# Patient Record
Sex: Female | Born: 1940
Health system: Southern US, Community
[De-identification: ages and names within clinical notes are randomized; demographics above are authoritative.]

## PROBLEM LIST (undated history)

## (undated) DIAGNOSIS — F17210 Nicotine dependence, cigarettes, uncomplicated: Secondary | ICD-10-CM

## (undated) DIAGNOSIS — L03115 Cellulitis of right lower limb: Secondary | ICD-10-CM

## (undated) DIAGNOSIS — K589 Irritable bowel syndrome without diarrhea: Secondary | ICD-10-CM

## (undated) DIAGNOSIS — F419 Anxiety disorder, unspecified: Secondary | ICD-10-CM

## (undated) DIAGNOSIS — M8580 Other specified disorders of bone density and structure, unspecified site: Secondary | ICD-10-CM

## (undated) DIAGNOSIS — I10 Essential (primary) hypertension: Secondary | ICD-10-CM

## (undated) DIAGNOSIS — M1990 Unspecified osteoarthritis, unspecified site: Secondary | ICD-10-CM

## (undated) DIAGNOSIS — K5730 Diverticulosis of large intestine without perforation or abscess without bleeding: Secondary | ICD-10-CM

## (undated) DIAGNOSIS — K219 Gastro-esophageal reflux disease without esophagitis: Secondary | ICD-10-CM

## (undated) DIAGNOSIS — E785 Hyperlipidemia, unspecified: Secondary | ICD-10-CM

## (undated) HISTORY — DX: Gastro-esophageal reflux disease without esophagitis: K21.9

## (undated) HISTORY — DX: Anxiety disorder, unspecified: F41.9

## (undated) HISTORY — DX: Nicotine dependence, cigarettes, uncomplicated: F17.210

## (undated) HISTORY — DX: Unspecified osteoarthritis, unspecified site: M19.90

## (undated) HISTORY — DX: Cellulitis of right lower limb: L03.115

## (undated) HISTORY — DX: Essential (primary) hypertension: I10

## (undated) HISTORY — PX: TOTAL ABDOMINAL HYSTERECTOMY: SHX209

## (undated) HISTORY — DX: Hyperlipidemia, unspecified: E78.5

## (undated) HISTORY — DX: Irritable bowel syndrome, unspecified: K58.9

## (undated) HISTORY — DX: Other specified disorders of bone density and structure, unspecified site: M85.80

## (undated) HISTORY — PX: APPENDECTOMY: SHX54

## (undated) HISTORY — DX: Diverticulosis of large intestine without perforation or abscess without bleeding: K57.30

---

## 2001-04-27 ENCOUNTER — Other Ambulatory Visit: Admission: RE | Admit: 2001-04-27 | Discharge: 2001-04-27 | Payer: Self-pay | Admitting: Obstetrics & Gynecology

## 2001-05-11 ENCOUNTER — Encounter: Payer: Self-pay | Admitting: Obstetrics & Gynecology

## 2001-05-11 ENCOUNTER — Encounter: Admission: RE | Admit: 2001-05-11 | Discharge: 2001-05-11 | Payer: Self-pay | Admitting: Obstetrics & Gynecology

## 2002-05-02 ENCOUNTER — Other Ambulatory Visit: Admission: RE | Admit: 2002-05-02 | Discharge: 2002-05-02 | Payer: Self-pay | Admitting: Obstetrics & Gynecology

## 2002-05-15 ENCOUNTER — Encounter: Admission: RE | Admit: 2002-05-15 | Discharge: 2002-05-15 | Payer: Self-pay | Admitting: Obstetrics & Gynecology

## 2002-05-15 ENCOUNTER — Encounter: Payer: Self-pay | Admitting: Obstetrics & Gynecology

## 2002-09-22 ENCOUNTER — Ambulatory Visit (HOSPITAL_COMMUNITY): Admission: RE | Admit: 2002-09-22 | Discharge: 2002-09-22 | Payer: Self-pay | Admitting: Pulmonary Disease

## 2002-09-22 ENCOUNTER — Encounter: Payer: Self-pay | Admitting: Pulmonary Disease

## 2003-07-16 ENCOUNTER — Encounter: Admission: RE | Admit: 2003-07-16 | Discharge: 2003-07-16 | Payer: Self-pay | Admitting: Obstetrics & Gynecology

## 2004-01-03 ENCOUNTER — Emergency Department (HOSPITAL_COMMUNITY): Admission: EM | Admit: 2004-01-03 | Discharge: 2004-01-04 | Payer: Self-pay | Admitting: Emergency Medicine

## 2004-07-17 ENCOUNTER — Ambulatory Visit: Payer: Self-pay | Admitting: Pulmonary Disease

## 2004-12-03 ENCOUNTER — Ambulatory Visit: Payer: Self-pay | Admitting: Pulmonary Disease

## 2004-12-05 ENCOUNTER — Ambulatory Visit: Payer: Self-pay | Admitting: Internal Medicine

## 2004-12-18 ENCOUNTER — Ambulatory Visit: Payer: Self-pay | Admitting: Internal Medicine

## 2005-01-09 ENCOUNTER — Ambulatory Visit: Payer: Self-pay | Admitting: Internal Medicine

## 2005-04-09 ENCOUNTER — Ambulatory Visit: Payer: Self-pay | Admitting: Pulmonary Disease

## 2005-06-04 ENCOUNTER — Ambulatory Visit: Payer: Self-pay | Admitting: Pulmonary Disease

## 2005-06-09 ENCOUNTER — Ambulatory Visit: Payer: Self-pay | Admitting: Pulmonary Disease

## 2005-06-17 ENCOUNTER — Encounter: Admission: RE | Admit: 2005-06-17 | Discharge: 2005-06-17 | Payer: Self-pay | Admitting: Obstetrics & Gynecology

## 2005-07-08 ENCOUNTER — Ambulatory Visit: Payer: Self-pay | Admitting: Pulmonary Disease

## 2005-12-21 ENCOUNTER — Ambulatory Visit: Payer: Self-pay | Admitting: Pulmonary Disease

## 2006-06-29 ENCOUNTER — Ambulatory Visit: Payer: Self-pay | Admitting: Pulmonary Disease

## 2006-09-01 ENCOUNTER — Ambulatory Visit: Payer: Self-pay | Admitting: Pulmonary Disease

## 2006-09-09 ENCOUNTER — Ambulatory Visit: Payer: Self-pay | Admitting: Pulmonary Disease

## 2006-09-09 LAB — CONVERTED CEMR LAB
ALT: 17 units/L (ref 0–40)
AST: 22 units/L (ref 0–37)
Albumin: 3.9 g/dL (ref 3.5–5.2)
Alkaline Phosphatase: 88 units/L (ref 39–117)
BUN: 14 mg/dL (ref 6–23)
CO2: 27 meq/L (ref 19–32)
Calcium: 9.1 mg/dL (ref 8.4–10.5)
Chloride: 103 meq/L (ref 96–112)
Chol/HDL Ratio, serum: 4.7
Cholesterol: 263 mg/dL (ref 0–200)
Creatinine, Ser: 0.8 mg/dL (ref 0.4–1.2)
GFR calc non Af Amer: 76 mL/min
Glomerular Filtration Rate, Af Am: 92 mL/min/{1.73_m2}
Glucose, Bld: 95 mg/dL (ref 70–99)
HDL: 55.7 mg/dL (ref >39.0)
LDL DIRECT: 180.2 mg/dL
Potassium: 4.1 meq/L (ref 3.5–5.1)
Sodium: 138 meq/L (ref 135–145)
Total Bilirubin: 0.7 mg/dL (ref 0.3–1.2)
Total Protein: 6.5 g/dL (ref 6.0–8.3)
Triglyceride fasting, serum: 126 mg/dL (ref 0–149)
VLDL: 25 mg/dL (ref 0–40)

## 2006-09-24 ENCOUNTER — Encounter: Admission: RE | Admit: 2006-09-24 | Discharge: 2006-09-24 | Payer: Self-pay | Admitting: Obstetrics & Gynecology

## 2006-10-13 ENCOUNTER — Ambulatory Visit: Payer: Self-pay | Admitting: Pulmonary Disease

## 2006-12-29 ENCOUNTER — Ambulatory Visit: Payer: Self-pay | Admitting: Pulmonary Disease

## 2006-12-29 LAB — CONVERTED CEMR LAB
ALT: 20 units/L (ref 0–40)
AST: 30 units/L (ref 0–37)
Albumin: 4.2 g/dL (ref 3.5–5.2)
Alkaline Phosphatase: 89 units/L (ref 39–117)
BUN: 10 mg/dL (ref 6–23)
Bacteria, UA: NEGATIVE
Basophils Absolute: 0.1 10*3/uL (ref 0.0–0.1)
Basophils Relative: 0.9 % (ref 0.0–1.0)
Bilirubin Urine: NEGATIVE
Bilirubin, Direct: 0.1 mg/dL (ref 0.0–0.3)
CO2: 29 meq/L (ref 19–32)
Calcium: 9.3 mg/dL (ref 8.4–10.5)
Chloride: 109 meq/L (ref 96–112)
Cholesterol: 221 mg/dL (ref 0–200)
Creatinine, Ser: 0.8 mg/dL (ref 0.4–1.2)
Crystals: NEGATIVE
Direct LDL: 132.3 mg/dL
Eosinophils Absolute: 0.1 10*3/uL (ref 0.0–0.6)
Eosinophils Relative: 1.5 % (ref 0.0–5.0)
GFR calc Af Amer: 92 mL/min
GFR calc non Af Amer: 76 mL/min
Glucose, Bld: 96 mg/dL (ref 70–99)
HCT: 40.1 % (ref 36.0–46.0)
HDL: 56.5 mg/dL (ref >39.0)
Hemoglobin: 13.8 g/dL (ref 12.0–15.0)
Ketones, ur: NEGATIVE mg/dL
Leukocytes, UA: NEGATIVE
Lymphocytes Relative: 31.1 % (ref 12.0–46.0)
MCHC: 34.4 g/dL (ref 30.0–36.0)
MCV: 92.3 fL (ref 78.0–100.0)
Monocytes Absolute: 0.3 10*3/uL (ref 0.2–0.7)
Monocytes Relative: 5 % (ref 3.0–11.0)
Mucus, UA: NEGATIVE
Neutro Abs: 4.2 10*3/uL (ref 1.4–7.7)
Neutrophils Relative %: 61.5 % (ref 43.0–77.0)
Nitrite: NEGATIVE
Platelets: 323 10*3/uL (ref 150–400)
Potassium: 4.3 meq/L (ref 3.5–5.1)
RBC: 4.34 M/uL (ref 3.87–5.11)
RDW: 12.1 % (ref 11.5–14.6)
Sodium: 144 meq/L (ref 135–145)
Specific Gravity, Urine: 1.02 (ref 1.000–1.03)
TSH: 2.61 microintl units/mL (ref 0.35–5.50)
Total Bilirubin: 0.7 mg/dL (ref 0.3–1.2)
Total CHOL/HDL Ratio: 3.9
Total Protein, Urine: NEGATIVE mg/dL
Total Protein: 7 g/dL (ref 6.0–8.3)
Triglycerides: 142 mg/dL (ref 0–149)
Urine Glucose: NEGATIVE mg/dL
Urobilinogen, UA: 0.2 (ref 0.0–1.0)
VLDL: 28 mg/dL (ref 0–40)
WBC, UA: NONE SEEN cells/hpf
WBC: 6.8 10*3/uL (ref 4.5–10.5)
pH: 6 (ref 5.0–8.0)

## 2007-06-15 ENCOUNTER — Ambulatory Visit: Payer: Self-pay | Admitting: Pulmonary Disease

## 2007-11-17 ENCOUNTER — Encounter: Admission: RE | Admit: 2007-11-17 | Discharge: 2007-11-17 | Payer: Self-pay | Admitting: Obstetrics & Gynecology

## 2008-01-07 ENCOUNTER — Encounter: Payer: Self-pay | Admitting: Pulmonary Disease

## 2008-01-07 DIAGNOSIS — E785 Hyperlipidemia, unspecified: Secondary | ICD-10-CM | POA: Insufficient documentation

## 2008-01-07 DIAGNOSIS — I10 Essential (primary) hypertension: Secondary | ICD-10-CM | POA: Insufficient documentation

## 2008-01-07 DIAGNOSIS — K219 Gastro-esophageal reflux disease without esophagitis: Secondary | ICD-10-CM | POA: Insufficient documentation

## 2008-02-16 ENCOUNTER — Telehealth (INDEPENDENT_AMBULATORY_CARE_PROVIDER_SITE_OTHER): Payer: Self-pay | Admitting: *Deleted

## 2008-02-20 ENCOUNTER — Telehealth: Payer: Self-pay | Admitting: Pulmonary Disease

## 2008-02-21 ENCOUNTER — Ambulatory Visit: Payer: Self-pay | Admitting: Pulmonary Disease

## 2008-02-22 ENCOUNTER — Ambulatory Visit: Payer: Self-pay | Admitting: Pulmonary Disease

## 2008-02-22 DIAGNOSIS — F17200 Nicotine dependence, unspecified, uncomplicated: Secondary | ICD-10-CM | POA: Insufficient documentation

## 2008-02-26 LAB — CONVERTED CEMR LAB
ALT: 20 units/L (ref 0–35)
AST: 27 units/L (ref 0–37)
Albumin: 4.3 g/dL (ref 3.5–5.2)
Alkaline Phosphatase: 80 units/L (ref 39–117)
BUN: 11 mg/dL (ref 6–23)
Basophils Absolute: 0 10*3/uL (ref 0.0–0.1)
Basophils Relative: 0.4 % (ref 0.0–1.0)
Bilirubin, Direct: 0.1 mg/dL (ref 0.0–0.3)
CO2: 28 meq/L (ref 19–32)
Calcium: 9.2 mg/dL (ref 8.4–10.5)
Chloride: 106 meq/L (ref 96–112)
Cholesterol: 195 mg/dL (ref 0–200)
Creatinine, Ser: 0.7 mg/dL (ref 0.4–1.2)
Eosinophils Absolute: 0.1 10*3/uL (ref 0.0–0.7)
Eosinophils Relative: 1.8 % (ref 0.0–5.0)
GFR calc Af Amer: 107 mL/min
GFR calc non Af Amer: 88 mL/min
Glucose, Bld: 94 mg/dL (ref 70–99)
HCT: 42.3 % (ref 36.0–46.0)
HDL: 57.2 mg/dL (ref >39.0)
Hemoglobin: 14.7 g/dL (ref 12.0–15.0)
Hgb A1c MFr Bld: 5.4 % (ref 4.6–6.0)
LDL Cholesterol: 119 mg/dL — ABNORMAL HIGH (ref 0–99)
Lymphocytes Relative: 29.2 % (ref 12.0–46.0)
MCHC: 34.7 g/dL (ref 30.0–36.0)
MCV: 93.6 fL (ref 78.0–100.0)
Monocytes Absolute: 0.5 10*3/uL (ref 0.1–1.0)
Monocytes Relative: 6.3 % (ref 3.0–12.0)
Neutro Abs: 4.6 10*3/uL (ref 1.4–7.7)
Neutrophils Relative %: 62.3 % (ref 43.0–77.0)
Platelets: 302 10*3/uL (ref 150–400)
Potassium: 4 meq/L (ref 3.5–5.1)
RBC: 4.52 M/uL (ref 3.87–5.11)
RDW: 11.6 % (ref 11.5–14.6)
Sodium: 142 meq/L (ref 135–145)
TSH: 2.32 microintl units/mL (ref 0.35–5.50)
Total Bilirubin: 0.8 mg/dL (ref 0.3–1.2)
Total CHOL/HDL Ratio: 3.4
Total Protein: 6.9 g/dL (ref 6.0–8.3)
Triglycerides: 95 mg/dL (ref 0–149)
VLDL: 19 mg/dL (ref 0–40)
WBC: 7.3 10*3/uL (ref 4.5–10.5)

## 2008-03-04 DIAGNOSIS — M899 Disorder of bone, unspecified: Secondary | ICD-10-CM | POA: Insufficient documentation

## 2008-03-04 DIAGNOSIS — K5730 Diverticulosis of large intestine without perforation or abscess without bleeding: Secondary | ICD-10-CM | POA: Insufficient documentation

## 2008-03-04 DIAGNOSIS — M1990 Unspecified osteoarthritis, unspecified site: Secondary | ICD-10-CM | POA: Insufficient documentation

## 2008-03-04 DIAGNOSIS — F411 Generalized anxiety disorder: Secondary | ICD-10-CM | POA: Insufficient documentation

## 2008-03-04 DIAGNOSIS — K589 Irritable bowel syndrome without diarrhea: Secondary | ICD-10-CM | POA: Insufficient documentation

## 2008-03-04 DIAGNOSIS — M949 Disorder of cartilage, unspecified: Secondary | ICD-10-CM

## 2008-06-06 ENCOUNTER — Ambulatory Visit: Payer: Self-pay | Admitting: Pulmonary Disease

## 2008-11-28 ENCOUNTER — Encounter: Admission: RE | Admit: 2008-11-28 | Discharge: 2008-11-28 | Payer: Self-pay | Admitting: Obstetrics & Gynecology

## 2009-02-18 ENCOUNTER — Encounter: Payer: Self-pay | Admitting: Pulmonary Disease

## 2009-03-29 ENCOUNTER — Ambulatory Visit: Payer: Self-pay | Admitting: Pulmonary Disease

## 2009-03-29 DIAGNOSIS — H409 Unspecified glaucoma: Secondary | ICD-10-CM | POA: Insufficient documentation

## 2009-03-29 LAB — CONVERTED CEMR LAB
ALT: 16 units/L (ref 0–35)
AST: 24 units/L (ref 0–37)
Albumin: 4.4 g/dL (ref 3.5–5.2)
Alkaline Phosphatase: 79 units/L (ref 39–117)
BUN: 15 mg/dL (ref 6–23)
Basophils Absolute: 0.1 10*3/uL (ref 0.0–0.1)
Basophils Relative: 0.8 % (ref 0.0–3.0)
Bilirubin, Direct: 0.1 mg/dL (ref 0.0–0.3)
CO2: 28 meq/L (ref 19–32)
Calcium: 9.5 mg/dL (ref 8.4–10.5)
Chloride: 109 meq/L (ref 96–112)
Cholesterol: 238 mg/dL — ABNORMAL HIGH (ref 0–200)
Creatinine, Ser: 0.6 mg/dL (ref 0.4–1.2)
Direct LDL: 160.4 mg/dL
Eosinophils Absolute: 0.1 10*3/uL (ref 0.0–0.7)
Eosinophils Relative: 1.2 % (ref 0.0–5.0)
GFR calc non Af Amer: 105.19 mL/min (ref >60)
Glucose, Bld: 90 mg/dL (ref 70–99)
HCT: 41 % (ref 36.0–46.0)
HDL: 56.8 mg/dL (ref >39.00)
Hemoglobin: 14.2 g/dL (ref 12.0–15.0)
Lymphocytes Relative: 41.1 % (ref 12.0–46.0)
Lymphs Abs: 2.8 10*3/uL (ref 0.7–4.0)
MCHC: 34.5 g/dL (ref 30.0–36.0)
MCV: 93.5 fL (ref 78.0–100.0)
Monocytes Absolute: 0.4 10*3/uL (ref 0.1–1.0)
Monocytes Relative: 5.8 % (ref 3.0–12.0)
Neutro Abs: 3.5 10*3/uL (ref 1.4–7.7)
Neutrophils Relative %: 51.1 % (ref 43.0–77.0)
Platelets: 270 10*3/uL (ref 150.0–400.0)
Potassium: 4.8 meq/L (ref 3.5–5.1)
RBC: 4.38 M/uL (ref 3.87–5.11)
RDW: 11.7 % (ref 11.5–14.6)
Sodium: 143 meq/L (ref 135–145)
TSH: 1.94 microintl units/mL (ref 0.35–5.50)
Total Bilirubin: 0.7 mg/dL (ref 0.3–1.2)
Total CHOL/HDL Ratio: 4
Total Protein: 7.2 g/dL (ref 6.0–8.3)
Triglycerides: 90 mg/dL (ref 0.0–149.0)
VLDL: 18 mg/dL (ref 0.0–40.0)
WBC: 6.9 10*3/uL (ref 4.5–10.5)

## 2009-04-05 ENCOUNTER — Encounter: Payer: Self-pay | Admitting: Pulmonary Disease

## 2009-05-22 ENCOUNTER — Ambulatory Visit: Payer: Self-pay | Admitting: Pulmonary Disease

## 2009-05-22 ENCOUNTER — Encounter: Payer: Self-pay | Admitting: Adult Health

## 2009-05-24 ENCOUNTER — Ambulatory Visit: Payer: Self-pay | Admitting: Pulmonary Disease

## 2009-05-31 ENCOUNTER — Ambulatory Visit: Payer: Self-pay | Admitting: Pulmonary Disease

## 2009-12-23 ENCOUNTER — Encounter: Admission: RE | Admit: 2009-12-23 | Discharge: 2009-12-23 | Payer: Self-pay | Admitting: Obstetrics & Gynecology

## 2010-01-16 ENCOUNTER — Encounter (INDEPENDENT_AMBULATORY_CARE_PROVIDER_SITE_OTHER): Payer: Self-pay | Admitting: *Deleted

## 2010-05-23 ENCOUNTER — Ambulatory Visit: Payer: Self-pay | Admitting: Pulmonary Disease

## 2010-05-23 ENCOUNTER — Encounter (INDEPENDENT_AMBULATORY_CARE_PROVIDER_SITE_OTHER): Payer: Self-pay | Admitting: *Deleted

## 2010-05-26 LAB — CONVERTED CEMR LAB
ALT: 18 units/L (ref 0–35)
AST: 33 units/L (ref 0–37)
Albumin: 4.6 g/dL (ref 3.5–5.2)
Alkaline Phosphatase: 81 units/L (ref 39–117)
BUN: 14 mg/dL (ref 6–23)
Basophils Absolute: 0 10*3/uL (ref 0.0–0.1)
Basophils Relative: 0.6 % (ref 0.0–3.0)
Bilirubin Urine: NEGATIVE
Bilirubin, Direct: 0.1 mg/dL (ref 0.0–0.3)
CO2: 28 meq/L (ref 19–32)
Calcium: 9.9 mg/dL (ref 8.4–10.5)
Chloride: 103 meq/L (ref 96–112)
Cholesterol: 255 mg/dL — ABNORMAL HIGH (ref 0–200)
Creatinine, Ser: 0.7 mg/dL (ref 0.4–1.2)
Direct LDL: 169.2 mg/dL
Eosinophils Absolute: 0.1 10*3/uL (ref 0.0–0.7)
Eosinophils Relative: 1.8 % (ref 0.0–5.0)
GFR calc non Af Amer: 92.3 mL/min (ref >60)
Glucose, Bld: 91 mg/dL (ref 70–99)
HCT: 40.3 % (ref 36.0–46.0)
HDL: 64.2 mg/dL (ref >39.00)
Hemoglobin: 14 g/dL (ref 12.0–15.0)
Ketones, ur: NEGATIVE mg/dL
Leukocytes, UA: NEGATIVE
Lymphocytes Relative: 43.5 % (ref 12.0–46.0)
Lymphs Abs: 2.8 10*3/uL (ref 0.7–4.0)
MCHC: 34.7 g/dL (ref 30.0–36.0)
MCV: 92.7 fL (ref 78.0–100.0)
Monocytes Absolute: 0.4 10*3/uL (ref 0.1–1.0)
Monocytes Relative: 6.2 % (ref 3.0–12.0)
Neutro Abs: 3.1 10*3/uL (ref 1.4–7.7)
Neutrophils Relative %: 47.9 % (ref 43.0–77.0)
Nitrite: NEGATIVE
Platelets: 288 10*3/uL (ref 150.0–400.0)
Potassium: 6.1 meq/L (ref 3.5–5.1)
RBC: 4.35 M/uL (ref 3.87–5.11)
RDW: 12.7 % (ref 11.5–14.6)
Sodium: 139 meq/L (ref 135–145)
Specific Gravity, Urine: 1.01 (ref 1.000–1.030)
TSH: 2.54 microintl units/mL (ref 0.35–5.50)
Total Bilirubin: 0.7 mg/dL (ref 0.3–1.2)
Total CHOL/HDL Ratio: 4
Total Protein, Urine: NEGATIVE mg/dL
Total Protein: 6.9 g/dL (ref 6.0–8.3)
Triglycerides: 90 mg/dL (ref 0.0–149.0)
Urine Glucose: NEGATIVE mg/dL
Urobilinogen, UA: 0.2 (ref 0.0–1.0)
VLDL: 18 mg/dL (ref 0.0–40.0)
WBC: 6.5 10*3/uL (ref 4.5–10.5)
pH: 6.5 (ref 5.0–8.0)

## 2010-06-03 ENCOUNTER — Telehealth (INDEPENDENT_AMBULATORY_CARE_PROVIDER_SITE_OTHER): Payer: Self-pay | Admitting: *Deleted

## 2010-06-13 ENCOUNTER — Encounter (INDEPENDENT_AMBULATORY_CARE_PROVIDER_SITE_OTHER): Payer: Self-pay

## 2010-06-17 ENCOUNTER — Ambulatory Visit: Payer: Self-pay | Admitting: Internal Medicine

## 2010-07-02 ENCOUNTER — Ambulatory Visit: Payer: Self-pay | Admitting: Internal Medicine

## 2010-07-02 HISTORY — PX: COLONOSCOPY: SHX174

## 2010-08-12 ENCOUNTER — Ambulatory Visit: Payer: Self-pay | Admitting: Internal Medicine

## 2010-08-12 ENCOUNTER — Telehealth (INDEPENDENT_AMBULATORY_CARE_PROVIDER_SITE_OTHER): Payer: Self-pay | Admitting: *Deleted

## 2010-08-12 ENCOUNTER — Ambulatory Visit: Payer: Self-pay | Admitting: Pulmonary Disease

## 2010-09-30 NOTE — Procedures (Signed)
Summary: Colonoscopy  Patient: Alicia Dickson No Note: All result statuses are Final unless otherwise noted.  Tests: (1) Colonoscopy (COL)   COL Colonoscopy           DONE     [facility]     [address]     [place], [place]  [address]           COLONOSCOPY PROCEDURE REPORT           PATIENT:  Alicia, Dickson  MR#:  [id number]     BIRTHDATE:  August 04, 1941, 69 yrs. old  GENDER:  female     ENDOSCOPIST:  Wilhemina Bonito. Eda Keys, MD     REF. BY:  Screening /  Recall     PROCEDURE DATE:  07/02/2010     PROCEDURE:  Higher-risk screening colonoscopy G0105           ASA CLASS:  Class II     INDICATIONS:  screening, family history of colon cancer - parent @     55     MEDICATIONS:   Fentanyl 50 mcg IV, Versed 8 mg IV           DESCRIPTION OF PROCEDURE:   After the risks benefits and     alternatives of the procedure were thoroughly explained, informed     consent was obtained.  Digital rectal exam was performed and     revealed no abnormalities.   The LB 180AL [id number] endoscope was     introduced through the anus and advanced to the cecum, which was     identified by both the appendix and ileocecal valve, without     limitations.Time to cecum = 5:32 min.  The quality of the prep was     excellent, using MoviPrep.  The instrument was then slowly     withdrawn (time = 7:55 min) as the colon was fully examined.     <<PROCEDUREIMAGES>>           FINDINGS:  Severe diverticulosis was found throughout the colon     with marked distal sigmoid stenosis.  This was otherwise a normal     examination of the colon.  No polyps or cancers were seen.     Retroflexed views in the rectum revealed no abnormalities.    The     scope was then withdrawn from the patient and the procedure     completed.           COMPLICATIONS:  None           ENDOSCOPIC IMPRESSION:     1) Severe diverticulosis throughout the colon, with sigmoid     stenosis     2) Otherwise normal examination     3) No polyps or  cancers     RECOMMENDATIONS:     1) Follow up colonoscopy in 5 years           ______________________________     Wilhemina Bonito. Eda Keys, MD           CC:  Michele Mcalpine, MD; The Patient           n.     eSIGNED:   Wilhemina Bonito. Eda Keys at 07/02/2010 02:52 PM           York Ram, [id number]  Note: An exclamation mark (!) indicates a result that was not dispersed into the flowsheet. Document Creation Date: 07/02/2010 2:53 PM _______________________________________________________________________  (1) Order result status: Final Collection or  observation date-time: 07/02/2010 14:45 Requested date-time:  Receipt date-time:  Reported date-time:  Referring Physician:   Ordering Physician: Fransico Setters [phone]) Specimen Source:  Source: Alicia Dickson Order Number: [id number] Lab site:   Appended Document: Colonoscopy    Clinical Lists Changes  Observations: Added new observation of COLONNXTDUE: 07/2015 (07/02/2010 15:08)

## 2010-09-30 NOTE — Letter (Signed)
Summary: [facility] Instructions  [facility]  [address] [place], [place] [address]   Phone: [phone]  Fax: [phone]       Alicia Dickson    [birth year]    MRN: [id number]        Procedure Day Dorna Bloom: Wednesday 07-02-10     Arrival Time: 1:00 p.m.     Procedure Time: 2:00 p.m.     Location of Procedure:                    _x _  [facility] (4th Floor)                        PREPARATION FOR COLONOSCOPY WITH MOVIPREP   Starting 5 days prior to your procedure  06-27-10 do not eat nuts, seeds, popcorn, corn, beans, peas,  salads, or any raw vegetables.  Do not take any fiber supplements (e.g. Metamucil, Citrucel, and Benefiber).  THE DAY BEFORE YOUR PROCEDURE         DATE:  07-01-10  DAY:  Tuesday  1.  Drink clear liquids the entire day-NO SOLID FOOD  2.  Do not drink anything colored red or purple.  Avoid juices with pulp.  No orange juice.  3.  Drink at least 64 oz. (8 glasses) of fluid/clear liquids during the day to prevent dehydration and help the prep work efficiently.  CLEAR LIQUIDS INCLUDE: Water Jello Ice Popsicles Tea (sugar ok, no milk/cream) Powdered fruit flavored drinks Coffee (sugar ok, no milk/cream) Gatorade Juice: apple, white grape, white cranberry  Lemonade Clear bullion, consomm, broth Carbonated beverages (any kind) Strained chicken noodle soup Hard Candy                             4.  In the morning, mix first dose of MoviPrep solution:    Empty 1 Pouch A and 1 Pouch B into the disposable container    Add lukewarm drinking water to the top line of the container. Mix to dissolve    Refrigerate (mixed solution should be used within 24 hrs)  5.  Begin drinking the prep at 5:00 p.m. The MoviPrep container is divided by 4 marks.   Every 15 minutes drink the solution down to the next mark (approximately 8 oz) until the full liter is complete.   6.  Follow completed prep with 16 oz of clear liquid of your  choice (Nothing red or purple).  Continue to drink clear liquids until bedtime.  7.  Before going to bed, mix second dose of MoviPrep solution:    Empty 1 Pouch A and 1 Pouch B into the disposable container    Add lukewarm drinking water to the top line of the container. Mix to dissolve    Refrigerate  THE DAY OF YOUR PROCEDURE      DATE:  07-02-10  DAY:  Wednesday  Beginning at  9:00 a.m. (5 hours before procedure):         1. Every 15 minutes, drink the solution down to the next mark (approx 8 oz) until the full liter is complete.  2. Follow completed prep with 16 oz. of clear liquid of your choice.    3. You may drink clear liquids until  12:00 p.m. (2 HOURS BEFORE PROCEDURE).   MEDICATION INSTRUCTIONS  Unless otherwise instructed, you should take regular prescription medications with a small sip of water  as early as possible the morning of your procedure.         OTHER INSTRUCTIONS  You will need a responsible adult at least 71 years of age to accompany you and drive you home.   This person must remain in the waiting room during your procedure.  Wear loose fitting clothing that is easily removed.  Leave jewelry and other valuables at home.  However, you may wish to bring a book to read or  an iPod/MP3 player to listen to music as you wait for your procedure to start.  Remove all body piercing jewelry and leave at home.  Total time from sign-in until discharge is approximately 2-3 hours.  You should go home directly after your procedure and rest.  You can resume normal activities the  day after your procedure.  The day of your procedure you should not:   Drive   Make legal decisions   Operate machinery   Drink alcohol   Return to work  You will receive specific instructions about eating, activities and medications before you leave.    The above instructions have been reviewed and explained to me by   Ulis Rias RN  June 17, 2010 1:01  PM     I fully understand and can verbalize these instructions _____________________________ Date _________

## 2010-09-30 NOTE — Assessment & Plan Note (Signed)
Summary: 1 YR PHYSICAL ///KP   CC:  14 month ROV & review of mult medical problems....  History of Present Illness: 70 y/o WF here for a follow up visit... she also goes by Senaida Lange (registered this way at the [facility])... she has multiple medical problems as noted below...    ~  March 29, 2009:  she had a good year but is stressed and fatigued/ tired... note sl left CWP at costal margin x several days, recent trip to [facility] to visit 77 y/o mother... still smoking, but may give Chantix a try... BP controlled & weight stable... had BMD from [facility] w/ worsening density ans osteoporosis but only on calcium/ vits... we discussed this but she still refuses bisposphonates, or any alternative Rx (Forteo, Prolia)...   ~  May 23, 2010:  Yearly ROV- still tired/ fatigued w/ [place] 55 y/[place]... she continues to smoke  ~1/4 ppd & never filled the Chantix (too $$$)... BP controlled on Felodipine & she states that she is now taking the Simva80 regularly but a call to her Pharm indicates refills  ~every other month & FLP today w/ persist elevated TChol & LDL> we will rec change to LIPITOR40... she is due for 40yr f/u colonoscopy w/ DrPerry... she still hasn't addressed the osteopenia w/ her GYN but says she will consider reclast once she sees how her sister does on this med... OK Flu shot & pneumovax today...    Current Problem List:  PHYSICAL EXAMINATION (ICD-V70.0) - GYN= DrLavoie for PAP, Mammograms, BMD... she has pt on ca++, MVI, VitD 50K per week (she was prev on Actonel but pt stopped)...  she had a Pneumovax in 2001... she takes ASA 81mg /d...  ~  7/10: non-compliance w/ meds- I called [facility]= no refills in several months (last was [facility] 11/17/08).  ~  9/11:  given PNEUMOVAX & seasonal flu vavvine today...  GLAUCOMA (ICD-365.9) - on gtts per DrScott...  CIGARETTE SMOKER (ICD-305.1) - still smokes 1/4 ppd... advised to quit totally and offered Chantix (but  she never filled Rx- too $$$)... she denies cough, sputum prod, hemoptysis, SOB, etc...  ~  CXR 9/11 showed scarring right base w/ sl elev hemidiaph, NAD...  HYPERTENSION (ICD-401.9) - on PLENDIL 10mg /d... BP= 136/82 today and averages 130's at home... denies HA, visual changes, CP, palipit, dizziness, syncope, dyspnea, edema, etc...  ~  [facility] confirm that she isn't taking med regularly.  ~  9/11:  pt states she is doing better & taking med more regularly.  HYPERLIPIDEMIA (ICD-272.4) - on ZOCOR 80mg /d now & tol well...  ~  FLP 4/06 showed TChol 208, TG 87, HDL 52, LDL 132... on Lipitor 20mg /d...  ~  FLP 4/08 on Simva40 showed TChol 221, TG 142, HDL 57, LDL 132... asked to incr to 80mg .  ~  FLP 6/09 on Simva80 showed TChol 195, TG 95, HDL 57, LDL 119  ~  FLP 7/10 showed TChol 238, TG 90, HDL 57, LDL 160... last refilled ?12/09?Marland Kitchen.. pt asked to take it regularly & call us for f/u labs on regular dose of med to assess her response (she never called for f/u FLP).  ~  FLP 9/11 showed TChol 255, TG 90, HDL 64, LDL 169... call to Pharm indicated she was filling the Rx every other month (therefore on Simva40/d)... rec> change to LIPITOR 40mg /d & call to check FLP after 1st of the yr.  GERD (ICD-530.81) -  uses OTC meds as needed.  DIVERTICULOSIS OF COLON (ICD-562.10) & IRRITABLE BOWEL SYNDROME (ICD-564.1) - last colonoscopy 5/06 by DrPerry showed divertics only... f/u planned 57yrs due to +fam hx colon cancer in father who died at age 44... we will set up repeat colon w/ DrPerry.  DEGENERATIVE JOINT DISEASE (ICD-715.90)  OSTEOPOROSIS (ICD-733.90) - BMD done by GYN... she was INTOL to Actonel, refuses to consider other bisphos or alternative Rx... she takes Caltrate (but stopped due to constip), Vits, Vit D (now 2000 u daily) according to the directions of DrLavoie.  ~  BMD [facility] showed TScores -2.5 in Spine, & -1.7 in right FemNeck...  ~  BMD 6/10 by  [facility] showed TScores -2.8 in Spine, & -2.0 in righjt FemNeck...  ANXIETY (ICD-300.00) - on WELLBUTRIN 75mg - 2 tabs daily... prev Rx w/ Alpraz as well but she stopped this medication.   Preventive Screening-Counseling & Management  Alcohol-Tobacco     Smoking Status: current  Comments: smokes no more than 5 cigs per day  Allergies: 1)  ! Pcn  Comments:  Nurse/Medical Assistant: The patient's medications and allergies were reviewed with the patient and were updated in the Medication and Allergy Lists.  Past History:  Past Medical History: Current Problems:  CELLULITIS, LEG, RIGHT (ICD-682.6) GLAUCOMA (ICD-365.9) CIGARETTE SMOKER (ICD-305.1) HYPERTENSION (ICD-401.9) HYPERLIPIDEMIA (ICD-272.4) GERD (ICD-530.81) DIVERTICULOSIS OF COLON (ICD-562.10) IRRITABLE BOWEL SYNDROME (ICD-564.1) DEGENERATIVE JOINT DISEASE (ICD-715.90) OSTEOPENIA (ICD-733.90) ANXIETY (ICD-300.00)  Past Surgical History: S/P appendectomy S/P hysterectomy  Family History: Reviewed history from 03/29/2009 and no changes required. Father died age 74 w/ colon cancer Mother still alive age 56 w/ HBP, Chol... No Siblings  Social History: Reviewed history from 03/29/2009 and no changes required. Widow 5 children +smoker, 3-4 cig/d now... social alcohol retired from [employer]  Review of Systems       The patient complains of fatigue, dyspnea on exertion, constipation, joint pain, and depression.  The patient denies fever, chills, sweats, anorexia, weakness, malaise, weight loss, sleep disorder, blurring, diplopia, eye irritation, eye discharge, vision loss, eye pain, photophobia, earache, ear discharge, tinnitus, decreased hearing, nasal congestion, nosebleeds, sore throat, hoarseness, chest pain, palpitations, syncope, orthopnea, PND, peripheral edema, cough, dyspnea at rest, excessive sputum, hemoptysis, wheezing, pleurisy, nausea, vomiting, diarrhea, change in bowel habits, abdominal pain,  melena, hematochezia, jaundice, gas/bloating, indigestion/heartburn, dysphagia, odynophagia, dysuria, hematuria, urinary frequency, urinary hesitancy, nocturia, incontinence, back pain, joint swelling, muscle cramps, muscle weakness, stiffness, arthritis, sciatica, restless legs, leg pain at night, leg pain with exertion, rash, itching, dryness, suspicious lesions, paralysis, paresthesias, seizures, tremors, vertigo, transient blindness, frequent falls, frequent headaches, difficulty walking, anxiety, memory loss, confusion, cold intolerance, heat intolerance, polydipsia, polyphagia, polyuria, unusual weight change, abnormal bruising, bleeding, enlarged lymph nodes, urticaria, allergic rash, hay fever, and recurrent infections.    Vital Signs:  Patient profile:   71 year old female Height:      59 inches Weight:      108.38 pounds BMI:     21.97 O2 Sat:      99 % on Room air Temp:     98.7 degrees F oral Pulse rate:   80 / minute BP sitting:   136 / 82  (right arm) Cuff size:   regular  Vitals Entered By: Randell Loop CMA (May 23, 2010 10:20 AM)  O2 Sat at Rest %:  99 O2 Flow:  Room air CC: 14 month ROV & review of mult medical problems... Is Patient Diabetic? No Pain Assessment Patient in pain? no  Onset of pain  Chronic Comments meds updated today with pt   Physical Exam  Additional Exam:  WD, petite, 70 y/o WF in NAD... GENERAL:  Alert & oriented; pleasant & cooperative... HEENT:  [place]/AT, EOM-wnl, PERRLA, EACs-clear, TMs-wnl, NOSE-clear, THROAT-clear & wnl. NECK:  Supple w/ full ROM; no JVD; normal carotid impulses w/o bruits; no thyromegaly or nodules palpated; no lymphadenopathy. CHEST:  Clear to P & A; without wheezes/ rales/ or rhonchi. HEART:  Regular Rhythm; without murmurs/ rubs/ or gallops. ABDOMEN:  Soft & nontender; normal bowel sounds; no organomegaly or masses detected. EXT: without deformities, mild arthritic changes; no varicose veins/ venous insuffic/  or edema. NEURO:  CN's intact;  no focal neuro deficits... DERM:  No lesions noted; no rash etc...    CXR  Procedure date:  05/23/2010  Findings:      CHEST - 2 VIEW Comparison: 03/29/2009   Findings: There is persistent elevation of the right hemidiaphragm with associated scarring at the right lung base.  The left lung is mildly hyperexpanded but clear.  The heart is normal in size and contour.  The upper abdomen and osseous structures are within normal limits.   IMPRESSION: No interval change in appearance of the chest detailed above.   Read By:  Henrene Pastor,  M.D.   MISC. Report  Procedure date:  05/23/2010  Findings:      BMP (METABOL)   Sodium                    139 mEq/L                   135-145   Potassium            [HH] 6.1 mEq/L                   3.5-5.1   Chloride                  103 mEq/L                   96-112   Carbon Dioxide            28 mEq/L                    19-32   Glucose                   91 mg/dL                    16-10   BUN                       14 mg/dL                    9-60   Creatinine                0.7 mg/dL                   4.5-4.0   Calcium                   9.9 mg/dL                   9.8-11.9   GFR                       92.30 mL/min                >  60  Hepatic/Liver Function Panel (HEPATIC)   Total Bilirubin           0.7 mg/dL                   6.4-3.3   Direct Bilirubin          0.1 mg/dL                   2.9-5.1   Alkaline Phosphatase      81 U/L                      39-117   AST                       33 U/L                      0-37   ALT                       18 U/L                      0-35   Total Protein             6.9 g/dL                    8.8-4.1   Albumin                   4.6 g/dL                    6.6-0.6  CBC Platelet w/Diff (CBCD)   White Cell Count          6.5 K/uL                    4.5-10.5   Red Cell Count            4.35 Mil/uL                 3.87-5.11   Hemoglobin                14.0  g/dL                   30.1-60.1   Hematocrit                40.3 %                      36.0-46.0   MCV                       92.7 fl                     78.0-100.0   Platelet Count            288.0 K/uL                  150.0-400.0   Neutrophil %              47.9 %                      43.0-77.0   Lymphocyte %              43.5 %  12.0-46.0   Monocyte %                6.2 %                       3.0-12.0  Comments:      Lipid Panel (LIPID)   Cholesterol          [H]  255 mg/dL                   6-045   Triglycerides             90.0 mg/dL                  4.0-981.1   HDL                       91.47 mg/dL                 >82.95  Cholesterol LDL - Direct                             169.2 mg/dL   TSH (TSH)   FastTSH                   2.54 uIU/mL                 0.35-5.50  UDip w/Micro (URINE)   Color                     LT. YELLOW   Clarity                   CLEAR                       Clear   Specific Gravity          1.010                       1.000 - 1.030   Urine Ph                  6.5                         5.0-8.0   Protein                   NEGATIVE                    Negative   Urine Glucose             NEGATIVE                    Negative   Ketones                   NEGATIVE                    Negative   Urine Bilirubin           NEGATIVE                    Negative   Blood                     TRACE-LYSED  Negative   Urobilinogen              0.2                         0.0 - 1.0   Leukocyte Esterace        NEGATIVE                    Negative   Nitrite                   NEGATIVE                    Negative   Urine RBC                 0-2/hpf                     0-2/hpf   Impression & Recommendations:  Problem # 1:  PHYSICAL EXAMINATION (ICD-V70.0) She is encouraged to quit smoking completely & given smoking cessation counselling (refuses Chantix)... we decided to switch to Lipitor40 given her poor FLP on the Simva  Rx... Orders: TLB-BMP (Basic Metabolic Panel-BMET) (80048-METABOL) TLB-Hepatic/Liver Function Pnl (80076-HEPATIC) TLB-CBC Platelet - w/Differential (85025-CBCD) TLB-Lipid Panel (80061-LIPID) TLB-TSH (Thyroid Stimulating Hormone) (84443-TSH) TLB-Udip w/ Micro (81001-URINE)  Problem # 2:  CIGARETTE SMOKER (ICD-305.1) As above> encouraged to quit smoking completely... Orders: T-2 View CXR (71020TC)  Problem # 3:  HYPERTENSION (ICD-401.9) BP controlled>  same meds. Her updated medication list for this problem includes:    Felodipine 10 Mg Tb24 (Felodipine) .Marland Kitchen... Take 1 tablet by mouth once a day  Problem # 4:  HYPERLIPIDEMIA (ICD-272.4) FLP on Simva ~40/d not at goal & she is rec to change to a diff statin, cost is an issue for her- therefore try Lip40. The following medications were removed from the medication list:    Simvastatin 80 Mg Tabs (Simvastatin) .Marland Kitchen... Take 1 tab by mouth at bedtime... Her updated medication list for this problem includes:    Lipitor 40 Mg Tabs (Atorvastatin calcium) .Marland Kitchen... Take 1 tab by mouth once daily...  Problem # 5:  DIVERTICULOSIS OF COLON (ICD-562.10) She is due for a 65yr f/u colon w/ DrPerry (+fam hx colon ca in father)... Orders: Gastroenterology Referral (GI)  Problem # 6:  OSTEOPENIA (ICD-733.90) She wants to wait & see how her sis does on Reclast then talk to [facility] about this med...  Problem # 7:  ANXIETY (ICD-300.00) She wants to continue on the wellbutrin Rx... Her updated medication list for this problem includes:    Wellbutrin 75 Mg Tabs (Bupropion hcl) .Marland Kitchen... Take 2 tabs by mouth once daily...  Problem # 8:  OTHER MEDICAL PROBLEMS AS NOTED>>> OK PNEUMOVAX today (age 72, prev vaccinated at age 81)... OK seasonal Flu vaccine today...  Complete Medication List: 1)  Zyrtec Allergy 10 Mg Tabs (Cetirizine hcl) .... As needed 2)  Nasonex 50 Mcg/act Susp (Mometasone furoate) .... As needed for nasal congestion 3)  Felodipine 10 Mg Tb24  (Felodipine) .... Take 1 tablet by mouth once a day 4)  Wellbutrin 75 Mg Tabs (Bupropion hcl) .... Take 2 tabs by mouth once daily.Marland KitchenMarland Kitchen 5)  Multivitamins Tabs (Multiple vitamin) .... Take 1 tab by mouth once daily.Marland KitchenMarland Kitchen 6)  Vitamin D3 2000 Unit Caps (Cholecalciferol) .... Take 1 cap by mouth once daily.Marland KitchenMarland Kitchen 7)  Lipitor 40 Mg Tabs (Atorvastatin calcium) .... Take 1 tab by mouth once daily.Marland KitchenMarland Kitchen  Other Orders: Pneumococcal Vaccine (02542) Admin 1st Vaccine (70623) Flu Vaccine 80yrs + MEDICARE PATIENTS (J6283) Administration Flu vaccine - MCR (T5176)  Patient Instructions: 1)  Today we updated your med list- see below.... 2)  We wrote your new perscriptions for 3 month supplies as requested... 3)  Today we did your follow up CXR & FASTING blood work... 4)  please call the "phone tree" in a few days for your lab results.Marland KitchenMarland Kitchen 5)  [name], you need to quit the last of those nasty cigarettes-please work on smoking cessation & let me know if you want to try the Chantix Rx.Marland KitchenMarland Kitchen 6)  Call for any problems.Marland KitchenMarland Kitchen 7)  Please schedule a follow-up appointment in 1 year. Prescriptions: LIPITOR 40 MG TABS (ATORVASTATIN CALCIUM) take 1 tab by mouth once daily...  #90 x 4   Entered and Authorized by:   Michele Mcalpine MD   Signed by:   Michele Mcalpine MD on 05/23/2010   Method used:   Print then Give to Patient   RxID:   [id number] NASONEX 50 MCG/ACT SUSP (MOMETASONE FUROATE) as needed for nasal congestion  #3 x 4   Entered and Authorized by:   Michele Mcalpine MD   Signed by:   Michele Mcalpine MD on 05/23/2010   Method used:   Print then Give to Patient   RxID:   [id number] WELLBUTRIN 75 MG  TABS (BUPROPION HCL) take 2 tabs by mouth once daily...  #180 x 4   Entered and Authorized by:   Michele Mcalpine MD   Signed by:   Michele Mcalpine MD on 05/23/2010   Method used:   Print then Give to Patient   RxID:   [id number] SIMVASTATIN 80 MG  TABS (SIMVASTATIN) take 1 tab by mouth at bedtime...  #90 x 4   Entered and  Authorized by:   Michele Mcalpine MD   Signed by:   Michele Mcalpine MD on 05/23/2010   Method used:   Print then Give to Patient   RxID:   [id number] FELODIPINE 10 MG  TB24 (FELODIPINE) Take 1 tablet by mouth once a day  #90 x 4   Entered and Authorized by:   Michele Mcalpine MD   Signed by:   Michele Mcalpine MD on 05/23/2010   Method used:   Print then Give to Patient   RxID:   [id number]    Immunizations Administered:  Pneumonia Vaccine:    Vaccine Type: Pneumovax    Site: right deltoid    Mfr: Merck    Dose: 0.5 ml    Route: IM    Given by: Randell Loop CMA    Exp. Date: 11/13/2011    Lot #: [id number]    VIS given: 08/05/09 version given May 23, 2010. Flu Vaccine Consent Questions     Do you have a history of severe allergic reactions to this vaccine? no    Any prior history of allergic reactions to egg and/or gelatin? no    Do you have a sensitivity to the preservative Thimersol? no    Do you have a past history of Guillan-Barre Syndrome? no    Do you currently have an acute febrile illness? no    Have you ever had a severe reaction to latex? no    Vaccine information given and explained to patient? yes    Are you currently pregnant? no    Lot Number:[id number]   Exp Date:02/28/2011   Site Given  Left  Deltoid IM version given May 23, 2010.  Randell Loop CMA  May 23, 2010 11:48 AM    .[email or web address]

## 2010-09-30 NOTE — Progress Notes (Signed)
Summary: questions about lipitor  Phone Note Call from Patient   Caller: Patient Call For: nadel Summary of Call: pt cannot afford lipitor would like samples if we have. Initial call taken by: Rickard Patience,  June 03, 2010 3:59 PM  Follow-up for Phone Call        called spoke with patient, who states she cannot afford her lipitor at $500.  we only have samples of lipitor 10mg  in the office, and not enough to really help her.  called spoke with [facility] at [facility]  who stated that pt acutally had 2 accounts in their system and thinks that is why the copay was si high - rx is actually $135 for the lipitor for a 90day supply.  i asked her to try crestor to see if it is any cheaper, which it is not ($135 for this as well).    called spoke with patient, informed her of what i found.  pt stated that the $135 for 90day is much better and will be able to afford this until lipitor goes generic (she states she was told this).  i advised pt that she can ask for 30days at a time to help her finances more.  pt was happy with this and verbalized her understanding.  called spoke with patient, informed Follow-up by: Boone Master CNA/MA,  June 03, 2010 5:20 PM

## 2010-09-30 NOTE — Letter (Signed)
Summary: Colonoscopy Letter  [facility]  [address] [place], [place] [address]   Phone: [phone]  Fax: [phone]      Jan 16, 2010 MRN: [id number]   CLOMA RAHRIG [address] [place], [place]  [address]   Dear Ms. Metzer,   According to your medical record, it is time for you to schedule a Colonoscopy. The American Cancer Society recommends this procedure as a method to detect early colon cancer. Patients with a family history of colon cancer, or a personal history of colon polyps or inflammatory bowel disease are at increased risk.  This letter has been generated based on the recommendations made at the time of your procedure. If you feel that in your particular situation this may no longer apply, please contact our office.  Please call our office at [phone] to schedule this appointment or to update your records at your earliest convenience.  Thank you for cooperating with [place] to provide you with the very best care possible.   Sincerely,  Wilhemina Bonito. Marina Goodell, M.D.  [facility] Gastroenterology Division [phone]

## 2010-09-30 NOTE — Miscellaneous (Signed)
Summary: Lec previsit  Clinical Lists Changes  Medications: Added new medication of MOVIPREP 100 GM  SOLR (PEG-KCL-NACL-NASULF-NA ASC-C) As per prep instructions. - Signed Rx of MOVIPREP 100 GM  SOLR (PEG-KCL-NACL-NASULF-NA ASC-C) As per prep instructions.;  #1 x 0;  Signed;  Entered by: Ulis Rias RN;  Authorized by: Hilarie Fredrickson MD;  Method used: [facility]  [phone]*, [address] [place], [place]  [address], Ph: [phone] or [phone], Fax: [phone] Observations: Added new observation of ALLERGY REV: Done (06/17/2010 12:21)    Prescriptions: MOVIPREP 100 GM  SOLR (PEG-KCL-NACL-NASULF-NA ASC-C) As per prep instructions.  #1 x 0   Entered by:   Ulis Rias RN   Authorized by:   Hilarie Fredrickson MD   Signed by:   Ulis Rias RN on 06/17/2010   Method used:   [facility]        [facility]  [phone][facility] (retail)       [address] [place], [place]  [address]       Ph: [phone] or [phone]       Fax: [phone]   RxID:   [id number]

## 2010-09-30 NOTE — Letter (Signed)
Summary: Pre Visit Letter Revised  [facility]  [address] [place], [place] [address]   Phone: [phone]  Fax: [phone]        05/23/2010 MRN: [id number] Alicia Dickson [address] [place], [place]  [address]             Procedure Date:  07/02/2010   Welcome to the Gastroenterology Division at [facility].    You are scheduled to see a nurse for your pre-procedure visit on 06/17/2010 at 1:00PM on the 3rd floor at [facility], [address]  We ask that you try to arrive at our office 15 minutes prior to your appointment time to allow for check-in.  Please take a minute to review the attached form.  If you answer "Yes" to one or more of the questions on the first page, we ask that you call the person listed at your earliest opportunity.  If you answer "No" to all of the questions, please complete the rest of the form and bring it to your appointment.    Your nurse visit will consist of discussing your medical and surgical history, your immediate family medical history, and your medications.   If you are unable to list all of your medications on the form, please bring the medication bottles to your appointment and we will list them.  We will need to be aware of both prescribed and over the counter drugs.  We will need to know exact dosage information as well.    Please be prepared to read and sign documents such as consent forms, a financial agreement, and acknowledgement forms.  If necessary, and with your consent, a friend or relative is welcome to sit-in on the nurse visit with you.  Please bring your insurance card so that we may make a copy of it.  If your insurance requires a referral to see a specialist, please bring your referral form from your primary care physician.  No co-pay is required for this nurse visit.     If you cannot keep your appointment, please call [phone] to cancel or reschedule prior to your appointment date.  This allows  [place] the opportunity to schedule an appointment for another patient in need of care.    Thank you for choosing [facility] for your medical needs.  We appreciate the opportunity to care for you.  Please visit [place] at our website  to learn more about our practice.  Sincerely, The Gastroenterology Division

## 2010-10-02 NOTE — Assessment & Plan Note (Signed)
Summary: Acute NP office visit - dog bite   CC:  dog bite on upper left thigh last PM and states drew blood. Marland Kitchen  History of Present Illness: 70 y/o WF    ~  March 29, 2009:  she had a good year but is stressed and fatigued/ tired... note sl left CWP at costal margin x several days, recent trip to [facility] to visit 42 y/o mother... still smoking, but may give Chantix a try... BP controlled & weight stable... had BMD from [facility] w/ worsening density ans osteoporosis but only on calcium/ vits... we discussed this but she still refuses bisposphonates, or any alternative Rx (Forteo, Prolia)...   ~  May 23, 2010:  Yearly ROV- still tired/ fatigued w/ [place] 43 y/o mother... she continues to smoke  ~1/4 ppd & never filled the Chantix (too $$$)... BP controlled on Felodipine & she states that she is now taking the Simva80 regularly but a call to her Pharm indicates refills  ~every other month & FLP today w/ persist elevated TChol & LDL> we will rec change to LIPITOR40... she is due for 89yr f/u colonoscopy w/ DrPerry... she still hasn't addressed the osteopenia w/ her GYN but says she will consider reclast once she sees how her sister does on this med... OK Flu shot & pneumovax today...  August 12, 2010--Presents for a work in visit. Reports she was bit by her neighbor's dog last night.  She was walking her dog and her neighbor's boxer attach her dog, she tried to protect it and unfortuanately dog bit her on the  upper left thigh. The site did bleed. She washed it thoroughly. It is sore at the site. Police was contacted and dog is in quarantine. His shots were UTD.  No significant redness. No drainage or fever. Denies chest pain, dyspnea, orthopnea, hemoptysis, fever, n/v/d, edema, headache,recent travel or antibiotics.      Medications Prior to Update: 1)  Zyrtec Allergy 10 Mg  Tabs (Cetirizine Hcl) .... As Needed 2)  Nasonex 50 Mcg/act Susp (Mometasone Furoate) .... As  Needed For Nasal Congestion 3)  Felodipine 10 Mg  Tb24 (Felodipine) .... Take 1 Tablet By Mouth Once A Day 4)  Wellbutrin 75 Mg  Tabs (Bupropion Hcl) .... Take 2 Tabs By Mouth Once Daily.Marland KitchenMarland Kitchen 5)  Multivitamins   Tabs (Multiple Vitamin) .... Take 1 Tab By Mouth Once Daily.Marland KitchenMarland Kitchen 6)  Vitamin D3 2000 Unit Caps (Cholecalciferol) .... Take 1 Cap By Mouth Once Daily.Marland KitchenMarland Kitchen 7)  Lipitor 40 Mg Tabs (Atorvastatin Calcium) .... Take 1 Tab By Mouth Once Daily...  Current Medications (verified): 1)  Zyrtec Allergy 10 Mg  Tabs (Cetirizine Hcl) .... As Needed 2)  Nasonex 50 Mcg/act Susp (Mometasone Furoate) .... As Needed For Nasal Congestion 3)  Felodipine 10 Mg  Tb24 (Felodipine) .... Take 1 Tablet By Mouth Once A Day 4)  Wellbutrin 75 Mg  Tabs (Bupropion Hcl) .... Take 2 Tabs By Mouth Once Daily.Marland KitchenMarland Kitchen 5)  Multivitamins   Tabs (Multiple Vitamin) .... Take 1 Tab By Mouth Once Daily.Marland KitchenMarland Kitchen 6)  Vitamin D3 2000 Unit Caps (Cholecalciferol) .... Take 1 Cap By Mouth Once Daily.Marland KitchenMarland Kitchen 7)  Lipitor 40 Mg Tabs (Atorvastatin Calcium) .... Take 1 Tab By Mouth Once Daily.Marland KitchenMarland Kitchen 8)  Timolol Maleate 0.25 % Soln (Timolol Maleate) .Marland Kitchen.. 1 Drop Each Eye Two Times A Day  Allergies (verified): 1)  ! Pcn  Past History:  Past Medical History: Last updated: 05/23/2010 Current Problems:  CELLULITIS,  LEG, RIGHT (ICD-682.6) GLAUCOMA (ICD-365.9) CIGARETTE SMOKER (ICD-305.1) HYPERTENSION (ICD-401.9) HYPERLIPIDEMIA (ICD-272.4) GERD (ICD-530.81) DIVERTICULOSIS OF COLON (ICD-562.10) IRRITABLE BOWEL SYNDROME (ICD-564.1) DEGENERATIVE JOINT DISEASE (ICD-715.90) OSTEOPENIA (ICD-733.90) ANXIETY (ICD-300.00)  Past Surgical History: Last updated: 2010-06-05 S/P appendectomy S/P hysterectomy  Family History: Last updated: June 05, 2010 Father died age 37 w/ colon cancer Mother still alive age 16 w/ HBP, Chol... No Siblings  Social History: Last updated: 06-05-2010 Widow 5 children +smoker, 3-4 cig/d now... social alcohol retired from  [employer]  Risk Factors: Smoking Status: current (06/05/10)  Review of Systems      See HPI  Vital Signs:  Patient profile:   70 year old female Height:      59 inches Weight:      106.56 pounds BMI:     21.60 O2 Sat:      98 % on Room air Temp:     97.9 degrees F oral Pulse rate:   88 / minute BP sitting:   142 / 82  (left arm) Cuff size:   regular  Vitals Entered By: Boone Master CNA/MA (August 12, 2010 2:24 PM)  O2 Flow:  Room air CC: dog bite on upper left thigh last PM, states drew blood.  Is Patient Diabetic? No Comments Medications reviewed with patient Daytime contact number verified with patient. Boone Master CNA/MA  August 12, 2010 2:24 PM    Physical Exam  Additional Exam:  GEN: A/Ox3; pleasant , NAD HEENT:  [place]/AT, , EACs-clear, TMs-wnl, NOSE-clear, THROAT-clear NECK:  Supple w/ fair ROM; no JVD; normal carotid impulses w/o bruits; no thyromegaly or nodules palpated; no lymphadenopathy. RESP  Clear to P & A; w/o, wheezes/ rales/ or rhonchi. CARD:  RRR, no m/r/g   GI:   Soft & nt; nml bowel sounds; no organomegaly or masses detected. Musco: Warm bil,  no calf tenderness  clubbing, pulses intact  Derm: along mid upper left thigh small scab/punture site , no significant redness no drainage.    Impression & Recommendations:  Problem # 1:  LACERATION (ICD-879.8)  Dog bite to Mid left upper thigh -dog shots utd.  TDAP today.  no sign of infection at this time.  Plan:  Tdap today  Wash area daily w/ gentle soap and water.  Pat dry and apply neosporin, cover w/ bandaid until healed.  Watch for signs of infection w/ redness, red streaks, and /or drainage. -call back will need antibitotics.  Please contact office for sooner follow up if symptoms do not improve or worsen   Orders: Est. Patient Level III ([address])  Medications Added to Medication List This Visit: 1)  Timolol Maleate 0.25 % Soln (Timolol maleate) .Marland Kitchen.. 1 drop each eye two times a  day  Patient Instructions: 1)  Tdap today  2)  Wash area daily w/ gentle soap and water.  3)  Pat dry and apply neosporin, cover w/ bandaid until healed.  4)  Watch for signs of infection w/ redness, red streaks, and /or drainage. -call back will need antibitotics.  5)  Please contact office for sooner follow up if symptoms do not improve or worsen

## 2010-10-02 NOTE — Assessment & Plan Note (Signed)
Summary: Tetanus shot per Leigh/ddp   Nurse Visit   Allergies: 1)  ! Pcn  Immunizations Administered:  Tetanus Vaccine:    Vaccine Type: Tdap    Site: left deltoid    Mfr: GlaxoSmithKline    Dose: 0.5 ml    Route: IM    Given by: Randell Loop CMA    Exp. Date: 11/23/2011    Lot #: [id number]    VIS given: 07/18/08 version given August 12, 2010.  Orders Added: 1)  Tdap => 60yrs IM [90715] 2)  Admin 1st Vaccine [14782]

## 2010-10-02 NOTE — Progress Notes (Signed)
Summary: ? last tetanus -- awaiting paper chart  Phone Note Call from Patient Call back at Home Phone [phone] Call back at [phone]--cell   Caller: Patient Call For: nadel Reason for Call: Talk to Nurse Summary of Call: Patient got bit by a dog last night and is needing to know when her last tetanus was. Initial call taken by: Lehman Prom,  August 12, 2010 8:11 AM  Follow-up for Phone Call        Paper chart requested. Abigail Miyamoto RN  August 12, 2010 9:13 AM   Additional Follow-up for Phone Call Additional follow up Details #1::        LMOMTCB x1.  Unable to find record of last Tetanus shot in EMR of paper chart.   Abigail Miyamoto RN  August 12, 2010 12:08 PM     Additional Follow-up for Phone Call Additional follow up Details #2::    Ok to give Tetanus per Dr Kriste Basque.  Pt instructed to come in after 2:00 today to receive shot. Abigail Miyamoto RN  August 12, 2010 12:13 PM

## 2010-12-01 ENCOUNTER — Encounter: Payer: Self-pay | Admitting: Adult Health

## 2010-12-01 ENCOUNTER — Ambulatory Visit (INDEPENDENT_AMBULATORY_CARE_PROVIDER_SITE_OTHER): Payer: Medicare Other | Admitting: Adult Health

## 2010-12-01 VITALS — BP 118/78 | HR 76 | Temp 97.4°F | Ht 59.0 in | Wt 110.6 lb

## 2010-12-01 DIAGNOSIS — M1990 Unspecified osteoarthritis, unspecified site: Secondary | ICD-10-CM

## 2010-12-01 MED ORDER — METAXALONE 800 MG PO TABS: 800.0000 mg | ORAL_TABLET | Freq: Three times a day (TID) | ORAL | Status: AC

## 2010-12-01 NOTE — Assessment & Plan Note (Signed)
Flare with thoracic strain.  Plan:  Ibuprofen 200mg  3 tabs Three times a day  With food for 5-7 days.  Alternate Ice and heat ~20 min As needed  To back.  Skelaxin 800mg  Three times a day  As needed  For muscle spasm.  Please contact office for sooner follow up if symptoms do not improve or worsen or seek emergency care   Avoid heavy lifting.  Light stretching as tolerated.

## 2010-12-01 NOTE — Patient Instructions (Signed)
Ibuprofen 200mg  3 tabs Three times a day  With food for 5-7 days.  Alternate Ice and heat ~20 min As needed  To back.  Skelaxin 800mg  Three times a day  As needed  For muscle spasm.  Please contact office for sooner follow up if symptoms do not improve or worsen or seek emergency care   Avoid heavy lifting.  Light stretching as tolerated.

## 2010-12-01 NOTE — Progress Notes (Signed)
  Subjective:    Patient ID: Alicia Dickson, female    DOB: Apr 01, 1941, 70 y.o.   MRN: [id number]  HPI 70 yo WF with known hx of HTN-active smoker, IBS, DJD, and Osteoporosis  12/01/2010 Acute OV- Pt presents for work in visit, complains of right back discomfort at the shoulder blade x2weeks. states occasionally radiates down the right arm when she is driving. Tylenol does not help. Worst pain if she is using computer or driving. No previous episodes. NO chest pain or dizziness. Pain comes and goes No neck pain or weakness in arms. Does not interfere with sleep. Tender to touch along right shoulder blade. No known injury.   Review of Systems   Constitutional:   No  weight loss, night sweats,  Fevers, chills, fatigue, lassitude. HEENT:   No headaches,  Difficulty swallowing,  Tooth/dental problems,  Sore throat,                No sneezing, itching, ear ache, nasal congestion, post nasal drip,   CV:  No chest pain,  Orthopnea, PND, swelling in lower extremities, anasarca, dizziness, palpitations  GI  No heartburn, indigestion, abdominal pain, nausea, vomiting, diarrhea, change in bowel habits, loss of appetite  Resp: No shortness of breath with exertion or at rest.  No excess mucus, no productive cough,  No non-productive cough,  No coughing up of blood.  No change in color of mucus.  No wheezing.  No chest wall deformity  Skin: no rash or lesions.  GU: no dysuria, change in color of urine, no urgency or frequency.  No flank pain.  MS:  No  swelling.  No decreased range of motion.   Psych:  No change in mood or affect. No depression or anxiety.  No memory loss.   Objective:   Physical Exam Gen: Pleasant, well-nourished, in no distress,  normal affect  ENT: No lesions,  mouth clear,  oropharynx clear, no postnasal drip  Neck: No JVD, no TMG, no carotid bruits  Lungs: No use of accessory muscles, no dullness to percussion, clear without rales or rhonchi  Cardiovascular: RRR, heart  sounds normal, no murmur or gallops, no peripheral edema  Abdomen: soft and NT, no HSM,  BS normal  Musculoskeletal: along right subscapular and shoulder area with tenderness to touch,  nml ROM of right shoulder- non reproducible pain, nml hand grips bilaterally, equal strength bilaterally, neg radicular symptoms Cervical ROM is normal.   Neuro: alert, non focal  Skin: Warm, no lesions or rashes         Assessment & Plan:

## 2010-12-19 ENCOUNTER — Telehealth: Payer: Self-pay | Admitting: Pulmonary Disease

## 2010-12-19 ENCOUNTER — Other Ambulatory Visit: Payer: Self-pay | Admitting: *Deleted

## 2010-12-19 DIAGNOSIS — M1990 Unspecified osteoarthritis, unspecified site: Secondary | ICD-10-CM

## 2010-12-19 MED ORDER — TIZANIDINE HCL 4 MG PO TABS: 4.0000 mg | ORAL_TABLET | Freq: Three times a day (TID) | ORAL | Status: DC

## 2010-12-19 NOTE — Telephone Encounter (Signed)
LMTCBx1 on home #, ATC cell number and line was busy. Carron Curie, CMA

## 2010-12-22 NOTE — Telephone Encounter (Signed)
Per SN----pt needs ortho eval for shoulder and neck   ASAP.Marland Kitchenrefer to [facility] for eval. thanks

## 2010-12-22 NOTE — Telephone Encounter (Signed)
Pt says she still has pain in her right shoulder with tingling in her fingers. She says this is worse when she is driving. She is requesting to see SN. Pls advise.

## 2010-12-22 NOTE — Telephone Encounter (Signed)
Called spoke with patient, advised of SN's recs.  Pt verbalized her understanding.  Order placed in epic for ASAP referral to ortho.

## 2010-12-22 NOTE — Telephone Encounter (Signed)
atc x2. Unable to hear pt on both the cell number and home number. wcb later

## 2011-01-12 ENCOUNTER — Other Ambulatory Visit: Payer: Self-pay | Admitting: Obstetrics & Gynecology

## 2011-01-12 DIAGNOSIS — Z1231 Encounter for screening mammogram for malignant neoplasm of breast: Secondary | ICD-10-CM

## 2011-01-16 NOTE — Assessment & Plan Note (Signed)
[facility]                             PULMONARY OFFICE NOTE   Alicia Dickson, Alicia Dickson                      MRN:          [id number]  DATE:09/01/2006                            DOB:          08-Feb-1941    HISTORY OF PRESENT ILLNESS:  The patient is a 70 year old white female  patient of Dr. Kriste Basque, who has a known history of hypertension,  hyperlipidemia and rhinitis and the patient, who continues to smoke,  presents for an acute office visit.  The patient complains of a five-day  history of nasal congestion, sinus pain and pressure and cough.  She  denies any fever, hemoptysis, chest pain, orthopnea, recent travel,  antibiotic use.   PAST MEDICAL HISTORY:  Reviewed.   CURRENT MEDICATIONS:  Reviewed.   PHYSICAL EXAMINATION:  The patient is a pleasant female, in no acute  distress.  She is afebrile with stable vital signs.  Her O2 saturation  is 98% on room air.  HEENT:  Nasal mucosa with some moderate edema.  Nontender sinuses to  percussion.  NECK:  The neck is supple, without adenopathy.  LUNGS:  Lung sounds reveal coarse breath sounds, without any wheezing or  crackles.  CARDIAC:  Regular rate and rhythm.  ABDOMEN:  Soft, benign.  EXTREMITIES:  Without any edema.   IMPRESSION AND PLAN:  Acute upper respiratory infection.  The patient  was given a Z-Pak.  Mucinex-DM twice a day.  Endal HD 8 ounces one to  two teaspoons every 4-6 hours as needed for cough.  The patient is  encouraged in smoking cessation.  The patient will check back with Dr.  Kriste Basque in two to three months as scheduled, or sooner if needed.      Rubye Oaks, NP  Electronically Signed      Lonzo Cloud. Kriste Basque, MD  Electronically Signed   TP/MedQ  DD: 09/01/2006  DT: 09/01/2006  Job #: [id number]

## 2011-01-21 ENCOUNTER — Other Ambulatory Visit: Payer: Self-pay | Admitting: Orthopaedic Surgery

## 2011-01-21 DIAGNOSIS — M542 Cervicalgia: Secondary | ICD-10-CM

## 2011-01-23 ENCOUNTER — Ambulatory Visit
Admission: RE | Admit: 2011-01-23 | Discharge: 2011-01-23 | Disposition: A | Discharge status: Home / Self Care | Payer: Medicare Other | Source: Ambulatory Visit | Attending: Obstetrics & Gynecology | Admitting: Obstetrics & Gynecology

## 2011-01-23 DIAGNOSIS — Z1231 Encounter for screening mammogram for malignant neoplasm of breast: Secondary | ICD-10-CM

## 2011-01-24 ENCOUNTER — Ambulatory Visit
Admission: RE | Admit: 2011-01-24 | Discharge: 2011-01-24 | Disposition: A | Discharge status: Home / Self Care | Payer: Medicare Other | Source: Ambulatory Visit | Attending: Orthopaedic Surgery | Admitting: Orthopaedic Surgery

## 2011-01-24 DIAGNOSIS — M542 Cervicalgia: Secondary | ICD-10-CM

## 2011-05-26 ENCOUNTER — Ambulatory Visit (INDEPENDENT_AMBULATORY_CARE_PROVIDER_SITE_OTHER): Payer: Medicare Other | Admitting: Pulmonary Disease

## 2011-05-26 ENCOUNTER — Ambulatory Visit (INDEPENDENT_AMBULATORY_CARE_PROVIDER_SITE_OTHER)
Admission: RE | Admit: 2011-05-26 | Discharge: 2011-05-26 | Disposition: A | Discharge status: Home / Self Care | Payer: Medicare Other | Source: Ambulatory Visit | Attending: Pulmonary Disease | Admitting: Pulmonary Disease

## 2011-05-26 ENCOUNTER — Other Ambulatory Visit (INDEPENDENT_AMBULATORY_CARE_PROVIDER_SITE_OTHER): Payer: Medicare Other

## 2011-05-26 ENCOUNTER — Encounter: Payer: Self-pay | Admitting: Pulmonary Disease

## 2011-05-26 DIAGNOSIS — K589 Irritable bowel syndrome without diarrhea: Secondary | ICD-10-CM

## 2011-05-26 DIAGNOSIS — E785 Hyperlipidemia, unspecified: Secondary | ICD-10-CM

## 2011-05-26 DIAGNOSIS — I10 Essential (primary) hypertension: Secondary | ICD-10-CM

## 2011-05-26 DIAGNOSIS — K5730 Diverticulosis of large intestine without perforation or abscess without bleeding: Secondary | ICD-10-CM

## 2011-05-26 DIAGNOSIS — M1990 Unspecified osteoarthritis, unspecified site: Secondary | ICD-10-CM

## 2011-05-26 DIAGNOSIS — M899 Disorder of bone, unspecified: Secondary | ICD-10-CM

## 2011-05-26 DIAGNOSIS — F17200 Nicotine dependence, unspecified, uncomplicated: Secondary | ICD-10-CM

## 2011-05-26 DIAGNOSIS — Z23 Encounter for immunization: Secondary | ICD-10-CM

## 2011-05-26 DIAGNOSIS — M949 Disorder of cartilage, unspecified: Secondary | ICD-10-CM

## 2011-05-26 DIAGNOSIS — F411 Generalized anxiety disorder: Secondary | ICD-10-CM

## 2011-05-26 DIAGNOSIS — K219 Gastro-esophageal reflux disease without esophagitis: Secondary | ICD-10-CM

## 2011-05-26 LAB — BASIC METABOLIC PANEL
BUN: 13 mg/dL (ref 6–23)
CO2: 28 mEq/L (ref 19–32)
Chloride: 106 mEq/L (ref 96–112)
Creatinine, Ser: 0.6 mg/dL (ref 0.4–1.2)
Glucose, Bld: 78 mg/dL (ref 70–99)
Potassium: 4.2 mEq/L (ref 3.5–5.1)

## 2011-05-26 LAB — LIPID PANEL
Cholesterol: 178 mg/dL (ref 0–200)
Triglycerides: 87 mg/dL (ref 0.0–149.0)

## 2011-05-26 LAB — TSH: TSH: 2.49 u[IU]/mL (ref 0.35–5.50)

## 2011-05-26 LAB — CBC WITH DIFFERENTIAL/PLATELET
Basophils Relative: 0.5 % (ref 0.0–3.0)
Eosinophils Relative: 1.8 % (ref 0.0–5.0)
Lymphocytes Relative: 45.8 % (ref 12.0–46.0)
Monocytes Absolute: 0.3 10*3/uL (ref 0.1–1.0)
Neutrophils Relative %: 46.7 % (ref 43.0–77.0)
Platelets: 303 10*3/uL (ref 150.0–400.0)
RBC: 4.38 Mil/uL (ref 3.87–5.11)
WBC: 5.8 10*3/uL (ref 4.5–10.5)

## 2011-05-26 LAB — HEPATIC FUNCTION PANEL
ALT: 21 U/L (ref 0–35)
AST: 27 U/L (ref 0–37)
Albumin: 4.3 g/dL (ref 3.5–5.2)
Total Protein: 7.1 g/dL (ref 6.0–8.3)

## 2011-05-26 MED ORDER — FELODIPINE ER 10 MG PO TB24: 10.0000 mg | ORAL_TABLET | Freq: Every day | ORAL | Status: DC

## 2011-05-26 MED ORDER — ATORVASTATIN CALCIUM 40 MG PO TABS: 40.0000 mg | ORAL_TABLET | Freq: Every day | ORAL | Status: DC

## 2011-05-26 MED ORDER — MOMETASONE FUROATE 50 MCG/ACT NA SUSP: 2.0000 | Freq: Every day | NASAL | Status: DC

## 2011-05-26 NOTE — Progress Notes (Signed)
Subjective:    Patient ID: Alicia Dickson, female    DOB: 10-20-1940, 70 y.o.   MRN: [id number]  HPI 69 y/o WF here for a follow up visit... she also goes by Alicia Dickson (registered this way at the [facility])... she has multiple medical problems as noted below...   ~  May 23, 2010:  Yearly ROV- still tired/ fatigued w/ [place] 95 y/o mother... she continues to smoke ~1/4 ppd & never filled the Chantix (too $$)... BP controlled on Felodipine & she states that she is now taking the Simva80 regularly but a call to her Pharm indicates refills ~every other month & FLP today w/ persist elevated TChol & LDL> we will rec change to LIPITOR40... she is due for 43yr f/u colonoscopy w/ DrPerry... she still hasn't addressed the osteopenia w/ her GYN but says she will consider reclast once she sees how her sister does on this med... OK Flu shot & pneumovax today...  ~  May 26, 2011:  1 Year ROV & she has mult minor somatic complaints> note right shoulder prob w/ eval DrBlackman including MRI which she reports showed a pinched nerve from arthritis, treated w/ shot & Ibuprofen; also has ?CTS w/ eval & Rx per DrNewton; I wonder about poss FM...    CigSmoker> she's cut down to 1cig/d now she says & using the E-cig which really helps; advised that she needs to quit completely, incr exercise etc...    HBP> on Plendil10mg /d; BP= 128/80 & similar at home; denies HA, CP, palpit, dizzy, SOB, edema, etc...    CHOL> on Lip40 but not really on diet she says; FLP shows all parameters at goal, continue same...    GI> GERD, Divertics, +FamHx> she uses OTC Prilosec as needed but doing well w/o symptoms; had f/u colonoscopy by DrPerry 11/11 w/ severe divertics and area of sigm stenosis; advised stool softeners, no nuts/ seeds/ etc...    DJD> she takes Ibuprofen as needed; followed by DrBlackman & DrNewton as noted above...    Osteoporosis> followed by her GYN & due for f/u BMD now; she  needs to reconsider Bisphos or other Rx & will discuss w/ her Gyn...    Anxiety/ Depression> off prev Wellbutrin, off prev Alpraz, and not currently on any meds for this...             Problem List:  GLAUCOMA (ICD-365.9) - on gtts per DrScott...  CIGARETTE SMOKER (ICD-305.1) - still smokes ~1cig/d now she says... advised to quit totally and offered Chantix (but she never filled Rx- too $$$)... she denies cough, sputum prod, hemoptysis, SOB, etc... ~  CXR 9/11 showed scarring right base w/ sl elev hemidiaph, NAD.Marland Kitchen. ~  CXR 9/12 showed stable elev right hemidiaph, sl incr markings, DJD sp, NAD...  HYPERTENSION (ICD-401.9) - on PLENDIL 10mg /d... ~  [facility] confirm that she isn't taking med regularly. ~  9/11:  pt states she is doing better & taking med more regularly. ~  9/12:  BP 130/80 range on the Plendil daily...  HYPERLIPIDEMIA (ICD-272.4) - on LIPITOR 40mg /d now & tol well... ~  FLP 4/06 on Lip20 showed TChol 208, TG 87, HDL 52, LDL 132... changed to Simva40. ~  FLP 4/08 on Simva40 showed TChol 221, TG 142, HDL 57, LDL 132... asked to incr to 80mg . ~  FLP 6/09 on Simva80 showed TChol 195, TG 95, HDL 57, LDL 119 ~  FLP 7/10  showed TChol 238, TG 90, HDL 57, LDL 160... last refilled ?12/09?> pt asked to take it regularly & call us for f/u labs on regular dose of med to assess her response (she never called for f/u FLP). ~  FLP 9/11 showed TChol 255, TG 90, HDL 64, LDL 169... call to Pharm indicated she was filling the Rx every other month (therefore on Simva40/d)... rec> change to LIPITOR 40mg /d & call to check FLP after 1st of the yr (she never called). ~  FLP 9/12 on Lip40 showed TChol 178, TG 87, HDL 65, LDL 95... Continue same.  GERD (ICD-530.81) - uses OTC meds as needed.  DIVERTICULOSIS OF COLON (ICD-562.10) & IRRITABLE BOWEL SYNDROME (ICD-564.1) >> +fam hx colon cancer in father who died at age 46...  ~  Colonoscopy 5/06 by DrPerry showed divertics  only... f/u planned 47yrs due to +fam hx colon cancer... ~  Colonoscopy 11/11 by DrPerry showed severe divertics, sigmoid stenosis, no polyps; f/u planned 5 yrs & rec for stool softeners etc...  DEGENERATIVE JOINT DISEASE (ICD-715.90) > Eval of neck, right shoulder, & CTS by DrBlackman & Newton; she's had shots in neck & takes IBUPROFEN as needed... DrBlackman did XRays and MRI of her neck, etc...  OSTEOPOROSIS (ICD-733.90) - BMD done by GYN... she was INTOL to Actonel, refuses to consider other bisphos or alternative Rx... she takes Caltrate (but stopped due to constip), Vits, Vit D (now 2000 u daily) according to the directions of DrLavoie. ~  BMD 4/08 [facility] showed TScores -2.5 in Spine, & -1.7 in right FemNeck... ~  BMD 6/10 by [facility] showed TScores -2.8 in Spine, & -2.0 in righjt FemNeck... ~  Now due for f/u BMD & consideration of additional therapy for her osteoporosis; pt states this will be done by GYN.  ANXIETY (ICD-300.00) - off prev Wellbutrin Rx... Also prev Rx w/ Rebeca Allegra but she stopped this medication as well...   Past Surgical History  Procedure Date  . Appendectomy   . Total abdominal hysterectomy     Outpatient Encounter Prescriptions as of 05/26/2011  Medication Sig Dispense Refill  . atorvastatin (LIPITOR) 40 MG tablet Take 40 mg by mouth at bedtime.       . cetirizine (ZYRTEC) 10 MG tablet Take 10 mg by mouth daily.        . Cholecalciferol (VITAMIN D) 2000 UNITS CAPS Take 1 capsule by mouth daily.        . felodipine (PLENDIL) 10 MG 24 hr tablet Take 10 mg by mouth daily.        Marland Kitchen ibuprofen (ADVIL,MOTRIN) 200 MG tablet Take 400 mg by mouth 2 (two) times daily.        . mometasone (NASONEX) 50 MCG/ACT nasal spray 2 sprays by Nasal route daily.        . Multiple Vitamin (MULTIVITAMIN) tablet Take 1 tablet by mouth daily.        . timolol (BETIMOL) 0.25 % ophthalmic solution Place 1 drop into both eyes 2 (two) times daily.        Marland Kitchen DISCONTD: buPROPion  (WELLBUTRIN) 75 MG tablet 2 tablets by mouth once daily       . DISCONTD: tiZANidine (ZANAFLEX) 4 MG tablet Take 1 tablet (4 mg total) by mouth 3 (three) times daily.  90 tablet  0    Allergies  Allergen Reactions  . Penicillins     REACTION: rash    Current Medications, Allergies, Past Medical History, Past Surgical History, Family History,  and Social History were reviewed in [employer] record.    Review of Systems         The patient complains of fatigue, dyspnea on exertion, constipation, and joint pain.  The patient denies fever, chills, sweats, anorexia, weakness, malaise, weight loss, sleep disorder, blurring, diplopia, eye irritation, eye discharge, vision loss, eye pain, photophobia, earache, ear discharge, tinnitus, decreased hearing, nasal congestion, nosebleeds, sore throat, hoarseness, chest pain, palpitations, syncope, orthopnea, PND, peripheral edema, cough, dyspnea at rest, excessive sputum, hemoptysis, wheezing, pleurisy, nausea, vomiting, diarrhea, change in bowel habits, abdominal pain, melena, hematochezia, jaundice, gas/bloating, indigestion/heartburn, dysphagia, odynophagia, dysuria, hematuria, urinary frequency, urinary hesitancy, nocturia, incontinence, back pain, joint swelling, muscle cramps, muscle weakness, stiffness, arthritis, sciatica, restless legs, leg pain at night, leg pain with exertion, rash, itching, dryness, suspicious lesions, paralysis, paresthesias, seizures, tremors, vertigo, transient blindness, frequent falls, frequent headaches, difficulty walking, anxiety, memory loss, confusion, cold intolerance, heat intolerance, polydipsia, polyphagia, polyuria, unusual weight change, abnormal bruising, bleeding, enlarged lymph nodes, urticaria, allergic rash, hay fever, and recurrent infections.     Objective:   Physical Exam     WD, petite, 70 y/o WF in NAD... GENERAL:  Alert & oriented; pleasant & cooperative... HEENT:  [place]/AT, EOM-wnl,  PERRLA, EACs-clear, TMs-wnl, NOSE-clear, THROAT-clear & wnl. NECK:  Supple w/ full ROM; no JVD; normal carotid impulses w/o bruits; no thyromegaly or nodules palpated; no lymphadenopathy. CHEST:  Clear to P & A; without wheezes/ rales/ or rhonchi. HEART:  Regular Rhythm; without murmurs/ rubs/ or gallops. ABDOMEN:  Soft & nontender; normal bowel sounds; no organomegaly or masses detected. EXT: without deformities, mild arthritic changes; no varicose veins/ venous insuffic/ or edema. NEURO:  CN's intact;  no focal neuro deficits... DERM:  No lesions noted; no rash etc...   Assessment & Plan:   CigSmoker> she's cut down to 1cig/d now she says & using the E-cig which really helps; advised that she needs to quit completely, incr exercise etc...     HBP> on Plendil10mg /d; BP= 128/80 & similar at home; continue same med & be sure to take regularly...     CHOL> on Lip40 but not really on diet she says; FLP shows all parameters at goal, continue same + better diet...     GI> GERD, Divertics, +FamHx> she uses OTC Prilosec as needed but doing well w/o symptoms; had f/u colonoscopy by DrPerry 11/11 w/ severe divertics and area of sigm stenosis; advised stool softeners, no nuts/ seeds/ etc...     DJD> she takes Ibuprofen as needed; followed by DrBlackman & DrNewton as noted above...     Osteoporosis> followed by her GYN & due for f/u BMD now; she needs to reconsider Bisphos or other Rx & will discuss w/ her Gyn...     Anxiety/ Depression> off prev Wellbutrin, off prev Alpraz, and not currently on any meds for this.Marland KitchenMarland Kitchen

## 2011-05-26 NOTE — Patient Instructions (Signed)
Today we updated your med list in EPIC...    We refilled your meds per request...  Today we did your follow up CXR &  fasting blood work...    Please call the PHONE [facility]REE in a few days for your results...    Dial [id number] & when prompted enter your patient number followed by the # symbol...    Your patient number is:  [id number]#  Keep up the good job w/ smoking cessation...  Remember to keep the bowel movements soft & easy to pass w/ MIRALAX and/or SENAKOT-S...  Call for any questions...  Let's plan another follow up visit in 1 yr, sooner if needed for problems.Marland KitchenMarland Kitchen

## 2011-05-27 ENCOUNTER — Encounter: Payer: Self-pay | Admitting: Pulmonary Disease

## 2011-05-27 LAB — VITAMIN D 25 HYDROXY (VIT D DEFICIENCY, FRACTURES): Vit D, 25-Hydroxy: 34 ng/mL (ref 30–89)

## 2012-01-11 ENCOUNTER — Other Ambulatory Visit: Payer: Self-pay | Admitting: Obstetrics & Gynecology

## 2012-01-11 DIAGNOSIS — Z1231 Encounter for screening mammogram for malignant neoplasm of breast: Secondary | ICD-10-CM

## 2012-01-26 ENCOUNTER — Ambulatory Visit
Admission: RE | Admit: 2012-01-26 | Discharge: 2012-01-26 | Disposition: A | Discharge status: Home / Self Care | Payer: Medicare Other | Source: Ambulatory Visit | Attending: Obstetrics & Gynecology | Admitting: Obstetrics & Gynecology

## 2012-01-26 DIAGNOSIS — Z1231 Encounter for screening mammogram for malignant neoplasm of breast: Secondary | ICD-10-CM

## 2012-05-25 ENCOUNTER — Ambulatory Visit (INDEPENDENT_AMBULATORY_CARE_PROVIDER_SITE_OTHER)
Admission: RE | Admit: 2012-05-25 | Discharge: 2012-05-25 | Disposition: A | Discharge status: Home / Self Care | Payer: Medicare Other | Source: Ambulatory Visit | Attending: Pulmonary Disease | Admitting: Pulmonary Disease

## 2012-05-25 ENCOUNTER — Other Ambulatory Visit (INDEPENDENT_AMBULATORY_CARE_PROVIDER_SITE_OTHER): Payer: Medicare Other

## 2012-05-25 ENCOUNTER — Ambulatory Visit (INDEPENDENT_AMBULATORY_CARE_PROVIDER_SITE_OTHER): Payer: Medicare Other | Admitting: Pulmonary Disease

## 2012-05-25 ENCOUNTER — Encounter: Payer: Self-pay | Admitting: Pulmonary Disease

## 2012-05-25 VITALS — BP 142/78 | HR 74 | Temp 97.6°F | Ht 59.0 in | Wt 106.2 lb

## 2012-05-25 DIAGNOSIS — K5730 Diverticulosis of large intestine without perforation or abscess without bleeding: Secondary | ICD-10-CM

## 2012-05-25 DIAGNOSIS — M1990 Unspecified osteoarthritis, unspecified site: Secondary | ICD-10-CM

## 2012-05-25 DIAGNOSIS — I10 Essential (primary) hypertension: Secondary | ICD-10-CM

## 2012-05-25 DIAGNOSIS — M899 Disorder of bone, unspecified: Secondary | ICD-10-CM

## 2012-05-25 DIAGNOSIS — F17200 Nicotine dependence, unspecified, uncomplicated: Secondary | ICD-10-CM

## 2012-05-25 DIAGNOSIS — F411 Generalized anxiety disorder: Secondary | ICD-10-CM

## 2012-05-25 DIAGNOSIS — J209 Acute bronchitis, unspecified: Secondary | ICD-10-CM

## 2012-05-25 DIAGNOSIS — K589 Irritable bowel syndrome without diarrhea: Secondary | ICD-10-CM

## 2012-05-25 DIAGNOSIS — E785 Hyperlipidemia, unspecified: Secondary | ICD-10-CM

## 2012-05-25 DIAGNOSIS — K219 Gastro-esophageal reflux disease without esophagitis: Secondary | ICD-10-CM

## 2012-05-25 LAB — BASIC METABOLIC PANEL
BUN: 16 mg/dL (ref 6–23)
Calcium: 9.6 mg/dL (ref 8.4–10.5)
Creatinine, Ser: 0.7 mg/dL (ref 0.4–1.2)
GFR: 93.39 mL/min (ref >60.00)
Glucose, Bld: 90 mg/dL (ref 70–99)
Sodium: 137 mEq/L (ref 135–145)

## 2012-05-25 LAB — LIPID PANEL
HDL: 54.6 mg/dL (ref >39.00)
VLDL: 23.4 mg/dL (ref 0.0–40.0)

## 2012-05-25 LAB — CBC WITH DIFFERENTIAL/PLATELET
Basophils Absolute: 0.1 10*3/uL (ref 0.0–0.1)
Eosinophils Absolute: 0.1 10*3/uL (ref 0.0–0.7)
HCT: 43.8 % (ref 36.0–46.0)
Lymphs Abs: 3.1 10*3/uL (ref 0.7–4.0)
MCV: 93.4 fl (ref 78.0–100.0)
Monocytes Absolute: 0.4 10*3/uL (ref 0.1–1.0)
Platelets: 294 10*3/uL (ref 150.0–400.0)
RDW: 12.3 % (ref 11.5–14.6)

## 2012-05-25 LAB — HEPATIC FUNCTION PANEL
AST: 22 U/L (ref 0–37)
Alkaline Phosphatase: 96 U/L (ref 39–117)
Total Bilirubin: 0.6 mg/dL (ref 0.3–1.2)

## 2012-05-25 LAB — TSH: TSH: 1.52 u[IU]/mL (ref 0.35–5.50)

## 2012-05-25 MED ORDER — ATORVASTATIN CALCIUM 40 MG PO TABS: 40.0000 mg | ORAL_TABLET | Freq: Every day | ORAL | Status: DC

## 2012-05-25 MED ORDER — AZITHROMYCIN 250 MG PO TABS: ORAL_TABLET | ORAL | Status: DC

## 2012-05-25 MED ORDER — METHYLPREDNISOLONE ACETATE 80 MG/ML IJ SUSP
80.0000 mg | Freq: Once | INTRAMUSCULAR | Status: AC
  Administered 2012-05-25: 80 mg via INTRAMUSCULAR

## 2012-05-25 MED ORDER — METHYLPREDNISOLONE 4 MG PO KIT: PACK | ORAL | Status: DC

## 2012-05-25 MED ORDER — MOMETASONE FUROATE 50 MCG/ACT NA SUSP: 2.0000 | Freq: Every day | NASAL | Status: DC

## 2012-05-25 MED ORDER — FELODIPINE ER 10 MG PO TB24: 10.0000 mg | ORAL_TABLET | Freq: Every day | ORAL | Status: DC

## 2012-05-25 NOTE — Patient Instructions (Addendum)
Today we updated your med list in our EPIC system...    Continue your current medications the same...    We refilled your meds as requested...  Today we did your follow up CXR & FASTING blood work...    We will call you w/ the results...  For your Bronchitis>    We decided to treat w/ a ZPak, Medrol dosepak, Mucinex & fluids...    Call for any problems...  Let's plan a follow up visit in 1 yr, sooner if needed for any reason.Marland KitchenMarland Kitchen

## 2012-05-25 NOTE — Progress Notes (Signed)
Subjective:    Patient ID: Alicia Dickson, female    DOB: Mar 20, 1941, 71 y.o.   MRN: [id number]  HPI 71 y/o WF here for a follow up visit... she also goes by Alicia Dickson (registered this way at the [facility])... she has multiple medical problems as noted below...   ~  May 23, 2010:  Yearly ROV- still tired/ fatigued w/ [place] 8 y/o mother... she continues to smoke ~1/4 ppd & never filled the Chantix (too $$)... BP controlled on Felodipine & she states that she is now taking the Simva80 regularly but a call to her Pharm indicates refills ~every other month & FLP today w/ persist elevated TChol & LDL> we will rec change to LIPITOR40... she is due for 48yr f/u colonoscopy w/ DrPerry... she still hasn't addressed the osteopenia w/ her GYN but says she will consider reclast once she sees how her sister does on this med... OK Flu shot & pneumovax today...  ~  May 26, 2011:  1 Year ROV & she has mult minor somatic complaints> note right shoulder prob w/ eval DrBlackman including MRI which she reports showed a pinched nerve from arthritis, treated w/ shot & Ibuprofen; also has ?CTS w/ eval & Rx per DrNewton; I wonder about poss FM...    CigSmoker> she's cut down to 1cig/d now she says & using the E-cig which really helps; advised that she needs to quit completely, incr exercise etc...    HBP> on Plendil10mg /d; BP= 128/80 & similar at home; denies HA, CP, palpit, dizzy, SOB, edema, etc...    CHOL> on Lip40 but not really on diet she says; FLP shows all parameters at goal, continue same...    GI> GERD, Divertics, +FamHx> she uses OTC Prilosec as needed but doing well w/o symptoms; had f/u colonoscopy by DrPerry 11/11 w/ severe divertics and area of sigm stenosis; advised stool softeners, no nuts/ seeds/ etc...    DJD> she takes Ibuprofen as needed; followed by DrBlackman & DrNewton as noted above...    Osteoporosis> followed by her GYN & due for f/u BMD now; she  needs to reconsider Bisphos or other Rx & will discuss w/ her Gyn...    Anxiety/ Depression> off prev Wellbutrin, off prev Alpraz, and not currently on any meds for this...  ~  May 25, 2012:  Yearly ROV & Alicia Dickson has been experiencing an URI since ret  From trip to [place]> w/ head & chest congestion, clear drainage, wheezing, etc; we decided to treat w/ Depo, Dosepak, & Zithromax... She is already using the Antihist Zyrtek, Nasonex, Mucinex, vs Netti pot...    CigSmoker> she's cut down to 1-2cig/d now she says & using the E-cig which really helps; advised that she needs to quit completely, incr exercise etc...    HBP> on Plendil10mg /d; BP= 142/78 & better at home she says; denies HA, CP, palpit, dizzy, SOB, edema, etc...    CHOL> on Lip40 but not really on diet she says; FLP shows all parameters at goal x LDL=109, continue same...    GI> GERD, Divertics, +FamHx> she uses OTC Prilosec as needed but doing well w/o symptoms; had f/u colonoscopy by DrPerry 11/11 w/ severe divertics and area of sigm stenosis; advised stool softeners, no nuts/ seeds/ etc...    DJD> she takes Ibuprofen as needed; followed by DrBlackman & DrNewton as noted above...    Osteoporosis> followed by her GYN & due for f/u BMD now; she needs  to reconsider Bisphos or other Rx & will discuss w/ her Gyn...    Anxiety/ Depression> off prev Wellbutrin, off prev Alpraz, and not currently on any meds for this... We reviewed prob list, meds, xrays and labs> see below for updates >> She will wait on flu shot til over this illness... CXR 9/13 showed normal heart size, stable pleaural thickening right angle, NAD... LABS 9/13:  FLP- ok x LDL=109;  Chems- wnl;  CBC- wnl;  TSH=1.52            Problem List:  GLAUCOMA (ICD-365.9) - on gtts per DrScott...  CIGARETTE SMOKER (ICD-305.1) - still smokes ~1-2cig/d now she says... advised to quit totally and offered Chantix (but she never filled Rx- too $$$)... she denies cough, sputum prod,  hemoptysis, SOB, etc... ~  CXR 9/11 showed scarring right base w/ sl elev hemidiaph, NAD.Marland Kitchen. ~  CXR 9/12 showed stable elev right hemidiaph, sl incr markings, DJD sp, NAD.Marland Kitchen. ~  CXR 9/13 showed normal heart size, stable pleaural thickening right angle, NAD...  HYPERTENSION (ICD-401.9) - on PLENDIL 10mg /d... ~  [facility]10: [facility] confirm that she isn't taking med regularly. ~  9/11:  pt states she is doing better & taking med more regularly. ~  9/12:  BP= 130/80 range on the Plendil daily... ~  9/13:  BP= 142/78 & she remains asymptomatic...  HYPERLIPIDEMIA (ICD-272.4) - on LIPITOR 40mg /d now & tol well... ~  FLP 4/06 on Lip20 showed TChol 208, TG 87, HDL 52, LDL 132... changed to Simva40. ~  FLP 4/08 on Simva40 showed TChol 221, TG 142, HDL 57, LDL 132... asked to incr to 80mg . ~  FLP 6/09 on Simva80 showed TChol 195, TG 95, HDL 57, LDL 119 ~  FLP 7/10 showed TChol 238, TG 90, HDL 57, LDL 160... last refilled ?12/09?> pt asked to take it regularly & call us for f/u labs on regular dose of med to assess her response (she never called for f/u FLP). ~  FLP 9/11 showed TChol 255, TG 90, HDL 64, LDL 169... call to Pharm indicated she was filling the Rx every other month (therefore on Simva40/d)... rec> change to LIPITOR 40mg /d & call to check FLP after 1st of the yr (she never called). ~  FLP 9/12 on Lip40 showed TChol 178, TG 87, HDL 65, LDL 95... Continue same. ~  FLP 9/13 on Lip40 showed TChol 187, TG 117, HDL 55, LDL 109  GERD (ICD-530.81) - uses OTC meds as needed.  DIVERTICULOSIS OF COLON (ICD-562.10) & IRRITABLE BOWEL SYNDROME (ICD-564.1) >> +fam hx colon cancer in father who died at age 37...  ~  Colonoscopy 5/06 by DrPerry showed divertics only... f/u planned 37yrs due to +fam hx colon cancer... ~  Colonoscopy 11/11 by DrPerry showed severe divertics, sigmoid stenosis, no polyps; f/u planned 5 yrs & rec for stool softeners etc...  DEGENERATIVE JOINT DISEASE  (ICD-715.90) > Eval of neck, right shoulder, & CTS by DrBlackman & Newton; she's had shots in neck & takes IBUPROFEN as needed... DrBlackman did XRays and MRI of her neck, etc... ~  9/13:  She reports "arthritis in my neck" & had 2 shots Dr "cute little guy" that really helped, we do not have records...  OSTEOPOROSIS (ICD-733.90) - BMD done by GYN... she was INTOL to Actonel, refuses to consider other bisphos or alternative Rx... she takes Caltrate (but stopped due to constip), Vits, Vit D (now 2000 u daily) according to the directions of DrLavoie. ~  BMD 4/08 by [facility] showed TScores -2.5 in Spine, & -1.7 in right FemNeck... ~  BMD 6/10 by [facility] showed TScores -2.8 in Spine, & -2.0 in righjt FemNeck... ~  Now due for f/u BMD & consideration of additional therapy for her osteoporosis; pt states this will be done by GYN.  ANXIETY (ICD-300.00) - off prev Wellbutrin Rx... Also prev Rx w/ Rebeca Allegra but she stopped this medication as well...  HEALTH MAINTENANCE:   ~  GI:  Followed by DrPerry & up to date... ~  GYN:  DrLavoie is her GYN & does BMDs in her office but pt is overdue 7 asked to inquire about this test... ~  Immuniz:  She gets the annual Flu vaccine...   Past Surgical History  Procedure Date  . Appendectomy   . Total abdominal hysterectomy     Outpatient Encounter Prescriptions as of 05/25/2012  Medication Sig Dispense Refill  . atorvastatin (LIPITOR) 40 MG tablet Take 1 tablet (40 mg total) by mouth at bedtime.  90 tablet  3  . cetirizine (ZYRTEC) 10 MG tablet Take 10 mg by mouth daily.        . Cholecalciferol (VITAMIN D) 2000 UNITS CAPS Take 1 capsule by mouth daily.        . felodipine (PLENDIL) 10 MG 24 hr tablet Take 1 tablet (10 mg total) by mouth daily.  90 tablet  3  . ibuprofen (ADVIL,MOTRIN) 200 MG tablet Take 400 mg by mouth 2 (two) times daily.        . mometasone (NASONEX) 50 MCG/ACT nasal spray Place 2 sprays into the nose daily.  51 g  3  . Multiple  Vitamin (MULTIVITAMIN) tablet Take 1 tablet by mouth daily.        . timolol (BETIMOL) 0.25 % ophthalmic solution Place 1 drop into both eyes 2 (two) times daily.          Allergies  Allergen Reactions  . Penicillins     REACTION: rash    Current Medications, Allergies, Past Medical History, Past Surgical History, Family History, and Social History were reviewed in [employer] record.    Review of Systems         The patient complains of fatigue, dyspnea on exertion, constipation, and joint pain.  The patient denies fever, chills, sweats, anorexia, weakness, malaise, weight loss, sleep disorder, blurring, diplopia, eye irritation, eye discharge, vision loss, eye pain, photophobia, earache, ear discharge, tinnitus, decreased hearing, nasal congestion, nosebleeds, sore throat, hoarseness, chest pain, palpitations, syncope, orthopnea, PND, peripheral edema, cough, dyspnea at rest, excessive sputum, hemoptysis, wheezing, pleurisy, nausea, vomiting, diarrhea, change in bowel habits, abdominal pain, melena, hematochezia, jaundice, gas/bloating, indigestion/heartburn, dysphagia, odynophagia, dysuria, hematuria, urinary frequency, urinary hesitancy, nocturia, incontinence, back pain, joint swelling, muscle cramps, muscle weakness, stiffness, arthritis, sciatica, restless legs, leg pain at night, leg pain with exertion, rash, itching, dryness, suspicious lesions, paralysis, paresthesias, seizures, tremors, vertigo, transient blindness, frequent falls, frequent headaches, difficulty walking, anxiety, memory loss, confusion, cold intolerance, heat intolerance, polydipsia, polyphagia, polyuria, unusual weight change, abnormal bruising, bleeding, enlarged lymph nodes, urticaria, allergic rash, hay fever, and recurrent infections.     Objective:   Physical Exam     WD, petite, 71 y/o WF in NAD... GENERAL:  Alert & oriented; pleasant & cooperative... HEENT:  [place]/AT, EOM-wnl, PERRLA,  EACs-clear, TMs-wnl, NOSE-clear, THROAT-clear & wnl. NECK:  Supple w/ full ROM; no JVD; normal carotid impulses w/o bruits; no thyromegaly or nodules palpated; no lymphadenopathy. CHEST:  Clear to P & A; without wheezes/ rales/ or rhonchi. HEART:  Regular Rhythm; without murmurs/ rubs/ or gallops. ABDOMEN:  Soft & nontender; normal bowel sounds; no organomegaly or masses detected. EXT: without deformities, mild arthritic changes; no varicose veins/ venous insuffic/ or edema. NEURO:  CN's intact;  no focal neuro deficits... DERM:  No lesions noted; no rash etc...  RADIOLOGY DATA:  Reviewed in the EPIC EMR & discussed w/ the patient...  LABORATORY DATA:  Reviewed in the EPIC EMR & discussed w/ the patient...   Assessment & Plan:    CigSmoker> she's cut down to 1-2cig/d now she says & using the E-cig which really helps; advised that she needs to quit completely, incr exercise etc...     HBP> on Plendil10mg /d; BP is ok & similar at home; continue same med & be sure to take regularly...     CHOL> on Lip40 but not really on diet she says; FLP shows all parameters at goal, continue same + better diet...     GI> GERD, Divertics, +FamHx> she uses OTC Prilosec as needed but doing well w/o symptoms; had f/u colonoscopy by DrPerry 11/11 w/ severe divertics and area of sigm stenosis; advised stool softeners, no nuts/ seeds/ etc...     DJD> she takes Ibuprofen as needed; followed by DrBlackman & DrNewton as noted above...     Osteoporosis> followed by her GYN & due for f/u BMD now; she needs to reconsider Bisphos or other Rx & will discuss w/ her Gyn...     Anxiety/ Depression> off prev Wellbutrin, off prev Alpraz, and not currently on any meds for this...      Patient's Medications  New Prescriptions   AZITHROMYCIN (ZITHROMAX) 250 MG TABLET    Take as directed   METHYLPREDNISOLONE (MEDROL, PAK,) 4 MG TABLET    follow package directions  Previous Medications   CETIRIZINE (ZYRTEC) 10 MG TABLET     Take 10 mg by mouth daily.     CHOLECALCIFEROL (VITAMIN D) 2000 UNITS CAPS    Take 1 capsule by mouth daily.     IBUPROFEN (ADVIL,MOTRIN) 200 MG TABLET    Take 400 mg by mouth 2 (two) times daily.     MULTIPLE VITAMIN (MULTIVITAMIN) TABLET    Take 1 tablet by mouth daily.     TIMOLOL (BETIMOL) 0.25 % OPHTHALMIC SOLUTION    Place 1 drop into both eyes 2 (two) times daily.    Modified Medications   Modified Medication Previous Medication   ATORVASTATIN (LIPITOR) 40 MG TABLET atorvastatin (LIPITOR) 40 MG tablet      Take 1 tablet (40 mg total) by mouth at bedtime.    Take 1 tablet (40 mg total) by mouth at bedtime.   FELODIPINE (PLENDIL) 10 MG 24 HR TABLET felodipine (PLENDIL) 10 MG 24 hr tablet      Take 1 tablet (10 mg total) by mouth daily.    Take 1 tablet (10 mg total) by mouth daily.   MOMETASONE (NASONEX) 50 MCG/ACT NASAL SPRAY mometasone (NASONEX) 50 MCG/ACT nasal spray      Place 2 sprays into the nose daily.    Place 2 sprays into the nose daily.  Discontinued Medications   No medications on file

## 2013-01-30 ENCOUNTER — Other Ambulatory Visit: Payer: Self-pay

## 2013-01-30 DIAGNOSIS — Z1231 Encounter for screening mammogram for malignant neoplasm of breast: Secondary | ICD-10-CM

## 2013-02-27 ENCOUNTER — Ambulatory Visit
Admission: RE | Admit: 2013-02-27 | Discharge: 2013-02-27 | Disposition: A | Discharge status: Home / Self Care | Payer: Medicare Other | Source: Ambulatory Visit

## 2013-02-27 DIAGNOSIS — Z1231 Encounter for screening mammogram for malignant neoplasm of breast: Secondary | ICD-10-CM

## 2013-03-22 ENCOUNTER — Encounter: Payer: Self-pay | Admitting: Adult Health

## 2013-03-22 ENCOUNTER — Ambulatory Visit (INDEPENDENT_AMBULATORY_CARE_PROVIDER_SITE_OTHER): Payer: Medicare Other | Admitting: Adult Health

## 2013-03-22 ENCOUNTER — Telehealth: Payer: Self-pay | Admitting: Pulmonary Disease

## 2013-03-22 VITALS — BP 104/64 | HR 81 | Temp 98.1°F | Ht 60.25 in | Wt 101.6 lb

## 2013-03-22 DIAGNOSIS — L0291 Cutaneous abscess, unspecified: Secondary | ICD-10-CM

## 2013-03-22 DIAGNOSIS — L0390 Cellulitis, unspecified: Secondary | ICD-10-CM

## 2013-03-22 MED ORDER — DOXYCYCLINE HYCLATE 100 MG PO TABS: 100.0000 mg | ORAL_TABLET | Freq: Two times a day (BID) | ORAL | Status: DC

## 2013-03-22 MED ORDER — PREDNISONE 10 MG PO TABS: ORAL_TABLET | ORAL | Status: DC

## 2013-03-22 NOTE — Progress Notes (Signed)
Subjective:    Patient ID: Alicia Dickson, female    DOB: 04/30/1941, 72 y.o.   MRN: [id number]  HPI 72y/o WF  Alicia Dickson)   ~  May 25, 2012:  Yearly ROV & Alicia Dickson has been experiencing an URI since ret  From trip to [place]> w/ head & chest congestion, clear drainage, wheezing, etc; we decided to treat w/ Depo, Dosepak, & Zithromax... She is already using the Antihist Zyrtek, Nasonex, Mucinex, vs Netti pot...    CigSmoker> she's cut down to 1-2cig/d now she says & using the E-cig which really helps; advised that she needs to quit completely, incr exercise etc...    HBP> on Plendil10mg /d; BP= 142/78 & better at home she says; denies HA, CP, palpit, dizzy, SOB, edema, etc...    CHOL> on Lip40 but not really on diet she says; FLP shows all parameters at goal x LDL=109, continue same...    GI> GERD, Divertics, +FamHx> she uses OTC Prilosec as needed but doing well w/o symptoms; had f/u colonoscopy by DrPerry 11/11 w/ severe divertics and area of sigm stenosis; advised stool softeners, no nuts/ seeds/ etc...    DJD> she takes Ibuprofen as needed; followed by DrBlackman & DrNewton as noted above...    Osteoporosis> followed by her GYN & due for f/u BMD now; she needs to reconsider Bisphos or other Rx & will discuss w/ her Gyn...    Anxiety/ Depression> off prev Wellbutrin, off prev Alpraz, and not currently on any meds for this... We reviewed prob list, meds, xrays and labs> see below for updates >> She will wait on flu shot til over this illness... CXR 9/13 showed normal heart size, stable pleaural thickening right angle, NAD... LABS 9/13:  FLP- ok x LDL=109;  Chems- wnl;  CBC- wnl;  TSH=1.52    03/22/2013 Acute OV  Complains of wasp/hornet sting on left ankle 2 days ago w/ redness, swelling, warm to touch, blister at sites, itching, some pain w/ weight bearing. Complains that swelling is not getting much better and worse in evening. Pain to touch.  No fever, chest pain, wheezing, tongue /lip swelling.  No difficult swallowing. Good appetite . No n/v .  Saw the hornet sting her twice before she could get away.           Problem List:  GLAUCOMA (ICD-365.9) - on gtts per DrScott...  CIGARETTE SMOKER (ICD-305.1) - still smokes ~1-2cig/d now she says... advised to quit totally and offered Chantix (but she never filled Rx- too $$$)... she denies cough, sputum prod, hemoptysis, SOB, etc... ~  CXR 9/11 showed scarring right base w/ sl elev hemidiaph, NAD.Marland Kitchen. ~  CXR 9/12 showed stable elev right hemidiaph, sl incr markings, DJD sp, NAD.Marland Kitchen. ~  CXR 9/13 showed normal heart size, stable pleaural thickening right angle, NAD...  HYPERTENSION (ICD-401.9) - on PLENDIL 10mg /d... ~  7/10: [facility] ([facility]) confirm that she isn't taking med regularly. ~  9/11:  pt states she is doing better & taking med more regularly. ~  9/12:  BP= 130/80 range on the Plendil daily... ~  9/13:  BP= 142/78 & she remains asymptomatic...  HYPERLIPIDEMIA (ICD-272.4) - on LIPITOR 40mg /d now & tol well... ~  FLP 4/06 on Lip20 showed TChol 208, TG 87, HDL 52, LDL 132... changed to Simva40. ~  FLP 4/08 on Simva40 showed TChol 221, TG 142, HDL 57, LDL 132... asked to incr to 80mg . ~  FLP 6/09 on Simva80 showed TChol 195, TG 95, HDL  57, LDL 119 ~  FLP 7/10 showed TChol 238, TG 90, HDL 57, LDL 160... last refilled ?12/09?> pt asked to take it regularly & call us for f/u labs on regular dose of med to assess her response (she never called for f/u FLP). ~  FLP 9/11 showed TChol 255, TG 90, HDL 64, LDL 169... call to Pharm indicated she was filling the Rx every other month (therefore on Simva40/d)... rec> change to LIPITOR 40mg /d & call to check FLP after 1st of the yr (she never called). ~  FLP 9/12 on Lip40 showed TChol 178, TG 87, HDL 65, LDL 95... Continue same. ~  FLP 9/13 on Lip40 showed TChol 187, TG 117, HDL 55, LDL 109  GERD (ICD-530.81) - uses OTC meds as needed.  DIVERTICULOSIS OF COLON (ICD-562.10) &  IRRITABLE BOWEL SYNDROME (ICD-564.1) >> +fam hx colon cancer in father who died at age 55...  ~  Colonoscopy 5/06 by DrPerry showed divertics only... f/u planned 18yrs due to +fam hx colon cancer... ~  Colonoscopy 11/11 by DrPerry showed severe divertics, sigmoid stenosis, no polyps; f/u planned 5 yrs & rec for stool softeners etc...  DEGENERATIVE JOINT DISEASE (ICD-715.90) > Eval of neck, right shoulder, & CTS by DrBlackman & Newton; she's had shots in neck & takes IBUPROFEN as needed... DrBlackman did XRays and MRI of her neck, etc... ~  9/13:  She reports "arthritis in my neck" & had 2 shots Dr "cute little guy" that really helped, we do not have records...  OSTEOPOROSIS (ICD-733.90) - BMD done by GYN... she was INTOL to Actonel, refuses to consider other bisphos or alternative Rx... she takes Caltrate (but stopped due to constip), Vits, Vit D (now 2000 u daily) according to the directions of DrLavoie. ~  [facility]MD [facility] showed TScores -2.5 in Spine, & -1.7 in right FemNeck... ~  BMD 6/10 by [facility] showed TScores -2.8 in Spine, & -2.0 in righjt FemNeck... ~  Now due for f/u BMD & consideration of additional therapy for her osteoporosis; pt states this will be done by GYN.  ANXIETY (ICD-300.00) - off prev Wellbutrin Rx... Also prev Rx w/ Rebeca Allegra but she stopped this medication as well...  HEALTH MAINTENANCE:   ~  GI:  Followed by DrPerry & up to date... ~  GYN:  DrLavoie is her GYN & does BMDs in her office but pt is overdue 7 asked to inquire about this test... ~  Immuniz:  She gets the annual Flu vaccine...   Past Surgical History  Procedure Laterality Date  . Appendectomy    . Total abdominal hysterectomy      Outpatient Encounter Prescriptions as of 03/22/2013  Medication Sig Dispense Refill  . atorvastatin (LIPITOR) 40 MG tablet Take 1 tablet (40 mg total) by mouth at bedtime.  90 tablet  3  . brimonidine (ALPHAGAN) 0.15 % ophthalmic solution Place 1 drop into both  eyes 2 (two) times daily.      . Cholecalciferol (VITAMIN D) 2000 UNITS CAPS Take 1 capsule by mouth daily.        . felodipine (PLENDIL) 10 MG 24 hr tablet Take 1 tablet (10 mg total) by mouth daily.  90 tablet  3  . ibuprofen (ADVIL,MOTRIN) 200 MG tablet Take 400 mg by mouth 2 (two) times daily.        . mometasone (NASONEX) 50 MCG/ACT nasal spray Place 2 sprays into the nose daily.  51 g  3  . Multiple Vitamin (MULTIVITAMIN) tablet  Take 1 tablet by mouth daily.        Marland Kitchen OVER THE COUNTER MEDICATION Mucinex Allergy once daily      . timolol (BETIMOL) 0.25 % ophthalmic solution Place 1 drop into both eyes 2 (two) times daily.        Marland Kitchen doxycycline (VIBRA-TABS) 100 MG tablet Take 1 tablet (100 mg total) by mouth 2 (two) times daily.  14 tablet  0  . predniSONE (DELTASONE) 10 MG tablet 4 tabs for 2 days, then 3 tabs for 2 days, 2 tabs for 2 days, then 1 tab for 2 days, then stop  20 tablet  0  . [DISCONTINUED] azithromycin (ZITHROMAX) 250 MG tablet Take as directed  6 each  0  . [DISCONTINUED] cetirizine (ZYRTEC) 10 MG tablet Take 10 mg by mouth daily.        . [DISCONTINUED] methylPREDNISolone (MEDROL, PAK,) 4 MG tablet follow package directions  21 tablet  0   No facility-administered encounter medications on file as of 03/22/2013.    Allergies  Allergen Reactions  . Penicillins     REACTION: rash    Current Medications, Allergies, Past Medical History, Past Surgical History, Family History, and Social History were reviewed in [employer] record.    Review of Systems         The patient complains of fatigue, dyspnea on exertion, constipation, and joint pain.  The patient denies fever, chills, sweats, anorexia, weakness, malaise, weight loss, sleep disorder, blurring, diplopia, eye irritation, eye discharge, vision loss, eye pain, photophobia, earache, ear discharge, tinnitus, decreased hearing, nasal congestion, nosebleeds, sore throat, hoarseness, chest pain,  palpitations, syncope, orthopnea, PND, peripheral edema, cough, dyspnea at rest, excessive sputum, hemoptysis, wheezing, pleurisy, nausea, vomiting, diarrhea, change in bowel habits, abdominal pain, melena, hematochezia, jaundice, gas/bloating, indigestion/heartburn, dysphagia, odynophagia, dysuria, hematuria, urinary frequency, urinary hesitancy, nocturia, incontinence, back pain, joint swelling, muscle cramps, muscle weakness, stiffness, arthritis, sciatica, restless legs, leg pain at night, leg pain with exertion, rash, itching, dryness, suspicious lesions, paralysis, paresthesias, seizures, tremors, vertigo, transient blindness, frequent falls, frequent headaches, difficulty walking, anxiety, memory loss, confusion, cold intolerance, heat intolerance, polydipsia, polyphagia, polyuria, unusual weight change, abnormal bruising, bleeding, enlarged lymph nodes,    Objective:   Physical Exam     WD, petite, 72 y/o WF in NAD... GENERAL:  Alert & oriented; pleasant & cooperative... HEENT:  [place]/AT, EOM-wnl, PERRLA, EACs-clear, TMs-wnl, NOSE-clear, THROAT-clear & wnl. NECK:  Supple w/ full ROM; no JVD; normal carotid impulses w/o bruits; no thyromegaly or nodules palpated; no lymphadenopathy. CHEST:  Clear to P & A; without wheezes/ rales/ or rhonchi. HEART:  Regular Rhythm; without murmurs/ rubs/ or gallops. ABDOMEN:  Soft & nontender; normal bowel sounds; no organomegaly or masses detected. EXT: without deformities, mild arthritic changes; no varicose veins/ venous insuffic/ or edema. NEURO:  CN's intact;  no focal neuro deficits... DERM:  Along posterior lower leg -distally with notable blisters, swelling, redness and warm to touch. Swelling extending into foot and upper leg. No red streaking noted.  Pulses intact.

## 2013-03-22 NOTE — Telephone Encounter (Signed)
Spoke with the pt She states stung by a wasp on 7/21 on her ankle  She has tried using benadryl ointment- but now area is red, swollen and painful down to her toes Pain unbearable and she has not been able to sleep  OV with TP at 9:45 am

## 2013-03-22 NOTE — Assessment & Plan Note (Signed)
Large localized Left lower leg allergic reaction secondary to bee sting  ? Possible secondary cellulitis  Swelling and redness are not receeding with localized treatment and benadryl   Plan  Keep leg elevated  Cool compresses to area several times a day  Begin Zyrtec 10mg  in am x 5 days  Begin Pepcid 20mg  At bedtime  For 5 days  May use Benadryl 25mg  every 6hr for extreme itching- may cause drowsiness.  Prednisone taper over next week.  Doxycycline 100mg  Twice daily  For 7 days- avoid sun exposure and take with food. (unable to take pcn/cephalospronins) follow up Dr. Kriste Basque  In 2 months  And As needed   Please contact office for sooner follow up if symptoms do not improve or worsen or seek emergency care

## 2013-03-22 NOTE — Patient Instructions (Addendum)
Keep leg elevated  Cool compresses to area several times a day  Begin Zyrtec 10mg  in am x 5 days  Begin Pepcid 20mg  At bedtime  For 5 days  May use Benadryl 25mg  every 6hr for extreme itching- may cause drowsiness.  Prednisone taper over next week.  Doxycycline 100mg  Twice daily  For 7 days- avoid sun exposure and take with food.  follow up Dr. Kriste Basque  In 2 months  And As needed   Please contact office for sooner follow up if symptoms do not improve or worsen or seek emergency care

## 2013-05-25 ENCOUNTER — Ambulatory Visit (INDEPENDENT_AMBULATORY_CARE_PROVIDER_SITE_OTHER)
Admission: RE | Admit: 2013-05-25 | Discharge: 2013-05-25 | Disposition: A | Discharge status: Home / Self Care | Payer: Medicare Other | Source: Ambulatory Visit | Attending: Pulmonary Disease | Admitting: Pulmonary Disease

## 2013-05-25 ENCOUNTER — Other Ambulatory Visit (INDEPENDENT_AMBULATORY_CARE_PROVIDER_SITE_OTHER): Payer: Medicare Other

## 2013-05-25 ENCOUNTER — Ambulatory Visit (INDEPENDENT_AMBULATORY_CARE_PROVIDER_SITE_OTHER): Payer: Medicare Other | Admitting: Pulmonary Disease

## 2013-05-25 ENCOUNTER — Encounter: Payer: Self-pay | Admitting: Pulmonary Disease

## 2013-05-25 VITALS — BP 132/84 | HR 70 | Temp 97.9°F | Ht 61.0 in | Wt 100.8 lb

## 2013-05-25 DIAGNOSIS — E785 Hyperlipidemia, unspecified: Secondary | ICD-10-CM

## 2013-05-25 DIAGNOSIS — I10 Essential (primary) hypertension: Secondary | ICD-10-CM

## 2013-05-25 DIAGNOSIS — K219 Gastro-esophageal reflux disease without esophagitis: Secondary | ICD-10-CM

## 2013-05-25 DIAGNOSIS — M899 Disorder of bone, unspecified: Secondary | ICD-10-CM

## 2013-05-25 DIAGNOSIS — K5730 Diverticulosis of large intestine without perforation or abscess without bleeding: Secondary | ICD-10-CM

## 2013-05-25 DIAGNOSIS — F411 Generalized anxiety disorder: Secondary | ICD-10-CM

## 2013-05-25 DIAGNOSIS — K589 Irritable bowel syndrome without diarrhea: Secondary | ICD-10-CM

## 2013-05-25 DIAGNOSIS — Z23 Encounter for immunization: Secondary | ICD-10-CM

## 2013-05-25 DIAGNOSIS — F17200 Nicotine dependence, unspecified, uncomplicated: Secondary | ICD-10-CM

## 2013-05-25 DIAGNOSIS — M1990 Unspecified osteoarthritis, unspecified site: Secondary | ICD-10-CM

## 2013-05-25 LAB — CBC WITH DIFFERENTIAL/PLATELET
Eosinophils Relative: 2.2 % (ref 0.0–5.0)
HCT: 43.1 % (ref 36.0–46.0)
Lymphs Abs: 3 10*3/uL (ref 0.7–4.0)
MCHC: 34.2 g/dL (ref 30.0–36.0)
MCV: 90.8 fl (ref 78.0–100.0)
Monocytes Absolute: 0.5 10*3/uL (ref 0.1–1.0)
Neutro Abs: 5.2 10*3/uL (ref 1.4–7.7)
Neutrophils Relative %: 58 % (ref 43.0–77.0)
Platelets: 322 10*3/uL (ref 150.0–400.0)
WBC: 8.9 10*3/uL (ref 4.5–10.5)

## 2013-05-25 LAB — TSH: TSH: 2.57 u[IU]/mL (ref 0.35–5.50)

## 2013-05-25 LAB — HEPATIC FUNCTION PANEL
AST: 29 U/L (ref 0–37)
Alkaline Phosphatase: 91 U/L (ref 39–117)
Bilirubin, Direct: 0.1 mg/dL (ref 0.0–0.3)
Total Bilirubin: 0.6 mg/dL (ref 0.3–1.2)

## 2013-05-25 LAB — BASIC METABOLIC PANEL
BUN: 14 mg/dL (ref 6–23)
CO2: 28 mEq/L (ref 19–32)
Chloride: 107 mEq/L (ref 96–112)
GFR: 105.99 mL/min (ref >60.00)
Potassium: 3.8 mEq/L (ref 3.5–5.1)
Sodium: 141 mEq/L (ref 135–145)

## 2013-05-25 LAB — LIPID PANEL
LDL Cholesterol: 96 mg/dL (ref 0–99)
VLDL: 16.2 mg/dL (ref 0.0–40.0)

## 2013-05-25 MED ORDER — FELODIPINE ER 10 MG PO TB24: 10.0000 mg | ORAL_TABLET | Freq: Every day | ORAL | Status: DC

## 2013-05-25 MED ORDER — ATORVASTATIN CALCIUM 40 MG PO TABS: 40.0000 mg | ORAL_TABLET | Freq: Every day | ORAL | Status: DC

## 2013-05-25 MED ORDER — SERTRALINE HCL 50 MG PO TABS: 50.0000 mg | ORAL_TABLET | Freq: Every day | ORAL | Status: DC

## 2013-05-25 NOTE — Progress Notes (Signed)
Subjective:    Patient ID: Alicia Dickson, female    DOB: 1941-06-07, 72 y.o.   MRN: [id number]  HPI 72 y/o WF here for a follow up visit... she also goes by Alicia Dickson (registered this way at the [facility])... she has multiple medical problems as noted below...   ~  May 23, 2010:  Yearly ROV- still tired/ fatigued [place]/ [place] 74 y/o [place]... she continues to smoke ~1/4 ppd & never filled the Chantix (too $$)... BP controlled on Felodipine & she states that she is now taking the Simva80 regularly but a call to her Pharm indicates refills ~every other month & FLP today w/ persist elevated TChol & LDL> we will rec change to LIPITOR40... she is due for 64yr f/u colonoscopy w/ DrPerry... she still hasn't addressed the osteopenia w/ her GYN but says she will consider reclast once she sees how her sister does on this med... OK Flu shot & pneumovax today...  ~  May 26, 2011:  1 Year ROV & she has mult minor somatic complaints> note right shoulder prob w/ eval DrBlackman including MRI which she reports showed a pinched nerve from arthritis, treated w/ shot & Ibuprofen; also has ?CTS w/ eval & Rx per DrNewton; I wonder about poss FM...    CigSmoker> she's cut down to 1cig/d now she says & using the E-cig which really helps; advised that she needs to quit completely, incr exercise etc...    HBP> on Plendil10mg /d; BP= 128/80 & similar at home; denies HA, CP, palpit, dizzy, SOB, edema, etc...    CHOL> on Lip40 but not really on diet she says; FLP shows all parameters at goal, continue same...    GI> GERD, Divertics, +FamHx> she uses OTC Prilosec as needed but doing well w/o symptoms; had f/u colonoscopy by DrPerry 11/11 w/ severe divertics and area of sigm stenosis; advised stool softeners, no nuts/ seeds/ etc...    DJD> she takes Ibuprofen as needed; followed by DrBlackman & DrNewton as noted above...    Osteoporosis> followed by her GYN & due for f/u BMD now; she  needs to reconsider Bisphos or other Rx & will discuss w/ her Gyn...    Anxiety/ Depression> off prev Wellbutrin, off prev Alpraz, and not currently on any meds for this...  ~  May 25, 2012:  Yearly ROV & Alicia Dickson has been experiencing an URI since ret  From trip to [place]> w/ head & chest congestion, clear drainage, wheezing, etc; we decided to treat w/ Depo, Dosepak, & Zithromax... She is already using the Antihist Zyrtek, Nasonex, Mucinex, vs Netti pot...    CigSmoker> she's cut down to 1-2cig/d now she says & using the E-cig which really helps; advised that she needs to quit completely, incr exercise etc...    HBP> on Plendil10mg /d; BP= 142/78 & better at home she says; denies HA, CP, palpit, dizzy, SOB, edema, etc...    CHOL> on Lip40 but not really on diet she says; FLP shows all parameters at goal x LDL=109, continue same...    GI> GERD, Divertics, +FamHx> she uses OTC Prilosec as needed but doing well w/o symptoms; had f/u colonoscopy by DrPerry 11/11 w/ severe divertics and area of sigm stenosis; advised stool softeners, no nuts/ seeds/ etc...    DJD> she takes Ibuprofen as needed; followed by DrBlackman & DrNewton as noted above...    Osteoporosis> followed by her GYN & due for f/u BMD now; she needs  to reconsider Bisphos or other Rx & will discuss w/ her Gyn...    Anxiety/ Depression> off prev Wellbutrin, off prev Alpraz, and not currently on any meds for this... We reviewed prob list, meds, xrays and labs> see below for updates >> She will wait on flu shot til over this illness... CXR 9/13 showed normal heart size, stable pleaural thickening right angle, NAD... LABS 9/13:  FLP- ok x LDL=109;  Chems- wnl;  CBC- wnl;  TSH=1.52   ~  May 25, 2013:  Yearly ROV & Alicia Dickson tells me that he mother passed away in [place] in 02/03/23 at age 51 (MI, pacer, staph infection); her CC= no energy, depressed 7 we discussed trial of Zoloft50; she does painting Materials engineer) as a hobby... We reviewed the following  medical problems during today's office visit >>     COPD, CigSmoker> not on inhalers, uses Zyrtek & Nasonex; she's cut down to 2-3cig/d now she says & using the E-cig which really helps; notes min cough, no sput/ hemoptysis, DOE w/o change; CXR w/ chr changes, NAD...     HBP> on Plendil10mg /d; BP= 132/84 & ok at home she says; denies HA, CP, palpit, dizzy, SOB, edema, etc...    CHOL> on Lip40 but not really on diet she says; FLP 9/14 shows TChol 189, TG 84, HDL 77, LDL 96    GI> GERD, Divertics, IBS, +FamHx colon ca> she uses OTC Prilosec/ Miralax as needed but doing well & denies abd pain, n/v, c/d/blood; last colonoscopy by DrPerry 11/11 w/ severe divertics and area of sigm stenosis; advised stool softeners, no nuts/ seeds/ etc.    DJD> she takes Ibuprofen as needed; followed by DrBlackman & DrNewton as noted...    Osteoporosis> followed by her GYN & overdue for f/u BMD; on calcium, MVI, VitD; she needs to reconsider Bisphos or other Rx & will discuss w/ her Gyn, DrLavoie...    Anxiety/ Depression> off prev Wellbutrin, off prev Alpraz, and not currently on any meds for this but feels depressed, wakes 4AM every day & incr fatigue since mother passed; try ZOLOFT50... We reviewed prob list, meds, xrays and labs> see below for updates >> OK TDAP & FLU vaccines today... CXR 9/14 showed COPD w/ some scarring, norm heart size, tort Ao, NAD.Marland KitchenMarland Kitchen EKG 9/14 showed NSR, rate71, WNL, NAD... LABS 9/14:  FLP- at goals on Lip40;  Chems- wnl;  CBC- wnl;  TSH=2.57;  VitD=69...           Problem List:  GLAUCOMA (ICD-365.9) - on gtts per DrScott...  CIGARETTE SMOKER (ICD-305.1) - still smokes ~1-2cig/d now she says... advised to quit totally and offered Chantix (but she never filled Rx- too $$$)... she denies cough, sputum prod, hemoptysis, SOB, etc... ~  CXR 9/11 showed scarring right base w/ sl elev hemidiaph, NAD.Marland Kitchen. ~  CXR 9/12 showed stable elev right hemidiaph, sl incr markings, DJD sp, NAD.Marland Kitchen. ~  CXR 9/13  showed normal heart size, stable pleaural thickening right angle, NAD.Marland Kitchen. ~  CXR 9/14 showed COPD w/ some scarring, norm heart size, tort Ao, NAD...  HYPERTENSION (ICD-401.9) - on PLENDIL 10mg /d... ~  [facility] confirm that she isn't taking med regularly. ~  9/11:  pt states she is doing better & taking med more regularly. ~  9/12:  BP= 130/80 range on the Plendil daily... ~  9/13:  BP= 142/78 & she remains asymptomatic... ~  9/14: on Plendil10mg /d; BP= 132/84 & ok at home she says; denies HA, CP,  palpit, dizzy, SOB, edema, etc.  HYPERLIPIDEMIA (ICD-272.4) - on LIPITOR 40mg /d now & tol well... ~  FLP 4/06 on Lip20 showed TChol 208, TG 87, HDL 52, LDL 132... changed to Simva40. ~  FLP 4/08 on Simva40 showed TChol 221, TG 142, HDL 57, LDL 132... asked to incr to 80mg . ~  FLP 6/09 on Simva80 showed TChol 195, TG 95, HDL 57, LDL 119 ~  FLP 7/10 showed TChol 238, TG 90, HDL 57, LDL 160... last refilled ?12/09?> pt asked to take it regularly & call us for f/u labs on regular dose of med to assess her response (she never called for f/u FLP). ~  FLP 9/11 showed TChol 255, TG 90, HDL 64, LDL 169... call to Pharm indicated she was filling the Rx every other month (therefore on Simva40/d)... rec> change to LIPITOR 40mg /d & call to check FLP after 1st of the yr (she never called). ~  FLP 9/12 on Lip40 showed TChol 178, TG 87, HDL 65, LDL 95... Continue same. ~  FLP 9/13 on Lip40 showed TChol 187, TG 117, HDL 55, LDL 109 ~  FLP 9/14 on Lip40 showed TChol 189, TG 84, HDL 77, LDL 96   GERD (ICD-530.81) - uses OTC meds as needed.  DIVERTICULOSIS OF COLON (ICD-562.10) & IRRITABLE BOWEL SYNDROME (ICD-564.1) >> +fam hx colon cancer in father who died at age 63...  ~  Colonoscopy 5/06 by DrPerry showed divertics only... f/u planned 48yrs due to +fam hx colon cancer... ~  Colonoscopy 11/11 by DrPerry showed severe divertics, sigmoid stenosis, no polyps; f/u planned 5 yrs & rec for  stool softeners etc...  DEGENERATIVE JOINT DISEASE (ICD-715.90) > Eval of neck, right shoulder, & CTS by DrBlackman & Newton; she's had shots in neck & takes IBUPROFEN as needed... DrBlackman did XRays and MRI of her neck, etc... ~  9/13:  She reports "arthritis in my neck" & had 2 shots Dr "cute little guy" that really helped, we do not have records...  OSTEOPOROSIS (ICD-733.90) - BMD done by GYN... she was INTOL to Actonel, refuses to consider other bisphos or alternative Rx... she takes Caltrate (but stopped due to constip), Vits, Vit D (now 2000 u daily) according to the directions of DrLavoie. ~  BMD [facility] showed TScores -2.5 in Spine, & -1.7 in right FemNeck... ~  BMD 6/10 by [facility] showed TScores -2.8 in Spine, & -2.0 in righjt FemNeck... ~  Now due for f/u BMD & consideration of additional therapy for her osteoporosis; pt states this will be done by GYN.  ANXIETY (ICD-300.00) - off prev Wellbutrin Rx... Also prev Rx w/ Rebeca Allegra but she stopped this medication as well... ~  9/14: off prev Wellbutrin, off prev Alpraz, and not currently on any meds for this, but feels depressed, wakes 4AM every day, & incr fatigue since mother passed; try ZOLOFT50.Marland Kitchen  HEALTH MAINTENANCE:   ~  GI:  Followed by DrPerry & up to date... ~  GYN:  DrLavoie is her GYN & does BMDs in her office but pt is overdue 7 asked to inquire about this test... ~  Immuniz:  She gets the annual Flu vaccine... Given TDAP 9/14 & the 2014 Flu shot.   Past Surgical History  Procedure Laterality Date  . Appendectomy    . Total abdominal hysterectomy      Outpatient Encounter Prescriptions as of 05/25/2013  Medication Sig Dispense Refill  . aspirin 81 MG tablet Take 81 mg by mouth daily.      Marland Kitchen  atorvastatin (LIPITOR) 40 MG tablet Take 1 tablet (40 mg total) by mouth at bedtime.  90 tablet  3  . brimonidine (ALPHAGAN) 0.15 % ophthalmic solution Place 1 drop into both eyes 2 (two) times daily.      .  cetirizine (ZYRTEC) 10 MG tablet Take 10 mg by mouth daily.      . Cholecalciferol (VITAMIN D) 2000 UNITS CAPS Take 1 capsule by mouth daily.        . Cyanocobalamin (VITAMIN B-12 PO) Take by mouth daily.      . felodipine (PLENDIL) 10 MG 24 hr tablet Take 1 tablet (10 mg total) by mouth daily.  90 tablet  3  . ibuprofen (ADVIL,MOTRIN) 200 MG tablet Take 400 mg by mouth 2 (two) times daily as needed.       . mometasone (NASONEX) 50 MCG/ACT nasal spray Place 2 sprays into the nose daily.  51 g  3  . Multiple Vitamin (MULTIVITAMIN) tablet Take 1 tablet by mouth daily.        . timolol (BETIMOL) 0.25 % ophthalmic solution Place 1 drop into both eyes 2 (two) times daily.        . [DISCONTINUED] doxycycline (VIBRA-TABS) 100 MG tablet Take 1 tablet (100 mg total) by mouth 2 (two) times daily.  14 tablet  0  . [DISCONTINUED] OVER THE COUNTER MEDICATION Mucinex Allergy once daily      . [DISCONTINUED] predniSONE (DELTASONE) 10 MG tablet 4 tabs for 2 days, then 3 tabs for 2 days, 2 tabs for 2 days, then 1 tab for 2 days, then stop  20 tablet  0   No facility-administered encounter medications on file as of 05/25/2013.    Allergies  Allergen Reactions  . Penicillins     REACTION: rash    Current Medications, Allergies, Past Medical History, Past Surgical History, Family History, and Social History were reviewed in [employer] record.    Review of Systems         The patient complains of fatigue, dyspnea on exertion, constipation, and joint pain.  The patient denies fever, chills, sweats, anorexia, weakness, malaise, weight loss, sleep disorder, blurring, diplopia, eye irritation, eye discharge, vision loss, eye pain, photophobia, earache, ear discharge, tinnitus, decreased hearing, nasal congestion, nosebleeds, sore throat, hoarseness, chest pain, palpitations, syncope, orthopnea, PND, peripheral edema, cough, dyspnea at rest, excessive sputum, hemoptysis, wheezing, pleurisy,  nausea, vomiting, diarrhea, change in bowel habits, abdominal pain, melena, hematochezia, jaundice, gas/bloating, indigestion/heartburn, dysphagia, odynophagia, dysuria, hematuria, urinary frequency, urinary hesitancy, nocturia, incontinence, back pain, joint swelling, muscle cramps, muscle weakness, stiffness, arthritis, sciatica, restless legs, leg pain at night, leg pain with exertion, rash, itching, dryness, suspicious lesions, paralysis, paresthesias, seizures, tremors, vertigo, transient blindness, frequent falls, frequent headaches, difficulty walking, anxiety, memory loss, confusion, cold intolerance, heat intolerance, polydipsia, polyphagia, polyuria, unusual weight change, abnormal bruising, bleeding, enlarged lymph nodes, urticaria, allergic rash, hay fever, and recurrent infections.     Objective:   Physical Exam     WD, petite, 72 y/o WF in NAD... GENERAL:  Alert & oriented; pleasant & cooperative... HEENT:  [place]/AT, EOM-wnl, PERRLA, EACs-clear, TMs-wnl, NOSE-clear, THROAT-clear & wnl. NECK:  Supple w/ full ROM; no JVD; normal carotid impulses w/o bruits; no thyromegaly or nodules palpated; no lymphadenopathy. CHEST:  Clear to P & A; without wheezes/ rales/ or rhonchi. HEART:  Regular Rhythm; without murmurs/ rubs/ or gallops. ABDOMEN:  Soft & nontender; normal bowel sounds; no organomegaly or masses detected. EXT: without deformities, mild  arthritic changes; no varicose veins/ venous insuffic/ or edema. NEURO:  CN's intact;  no focal neuro deficits... DERM:  No lesions noted; no rash etc...  RADIOLOGY DATA:  Reviewed in the EPIC EMR & discussed w/ the patient...  LABORATORY DATA:  Reviewed in the EPIC EMR & discussed w/ the patient...   Assessment & Plan:    CigSmoker> she's cut down to 2-3cig/d now she says & using the E-cig which really helps; advised that she needs to quit completely, incr exercise etc...     HBP> on Plendil10mg /d; BP is ok & similar at home; continue same  med & be sure to take regularly...     CHOL> on Lip40 but not really on diet she says; FLP shows all parameters at goal, continue same + better diet...     GI> GERD, Divertics, +FamHx> she uses OTC Prilosec as needed but doing well w/o symptoms; had f/u colonoscopy by DrPerry 11/11 w/ severe divertics and area of sigm stenosis; advised stool softeners, no nuts/ seeds/ etc...     DJD> she takes Ibuprofen as needed; followed by DrBlackman & DrNewton as noted above...     Osteoporosis> followed by her GYN & due for f/u BMD now; she needs to reconsider Bisphos or other Rx & will discuss w/ her Gyn...     Anxiety/ Depression> we will try ZOLOFT50...      Patient's Medications  New Prescriptions   No medications on file  Previous Medications   ASPIRIN 81 MG TABLET    Take 81 mg by mouth daily.   ATORVASTATIN (LIPITOR) 40 MG TABLET    Take 1 tablet (40 mg total) by mouth at bedtime.   BRIMONIDINE (ALPHAGAN) 0.15 % OPHTHALMIC SOLUTION    Place 1 drop into both eyes 2 (two) times daily.   CETIRIZINE (ZYRTEC) 10 MG TABLET    Take 10 mg by mouth daily.   CHOLECALCIFEROL (VITAMIN D) 2000 UNITS CAPS    Take 1 capsule by mouth daily.     CYANOCOBALAMIN (VITAMIN B-12 PO)    Take by mouth daily.   FELODIPINE (PLENDIL) 10 MG 24 HR TABLET    Take 1 tablet (10 mg total) by mouth daily.   IBUPROFEN (ADVIL,MOTRIN) 200 MG TABLET    Take 400 mg by mouth 2 (two) times daily as needed.    MOMETASONE (NASONEX) 50 MCG/ACT NASAL SPRAY    Place 2 sprays into the nose daily.   MULTIPLE VITAMIN (MULTIVITAMIN) TABLET    Take 1 tablet by mouth daily.     TIMOLOL (BETIMOL) 0.25 % OPHTHALMIC SOLUTION    Place 1 drop into both eyes 2 (two) times daily.    Modified Medications   No medications on file  Discontinued Medications   DOXYCYCLINE (VIBRA-TABS) 100 MG TABLET    Take 1 tablet (100 mg total) by mouth 2 (two) times daily.   OVER THE COUNTER MEDICATION    Mucinex Allergy once daily   PREDNISONE (DELTASONE) 10 MG  TABLET    4 tabs for 2 days, then 3 tabs for 2 days, 2 tabs for 2 days, then 1 tab for 2 days, then stop

## 2013-05-25 NOTE — Patient Instructions (Addendum)
Today we updated your med list in our EPIC system...    Continue your current medications the same...  We decided to add SERTRALINE 50mg  one tab at bedtime to see if it perks up your mood 7 energy...  Today we did your follow up CXR, EKG, & FASTING blood work...    We will contact you w/ the results when available...   We also gave you a combinaton tetanus shot (good for 10 yrs)    And the 2014 FLU vaccine...  Call for any questions...  Let's plan a follow up visit in 13yr, sooner if needed for problems.Marland KitchenMarland Kitchen

## 2013-10-02 ENCOUNTER — Telehealth: Payer: Self-pay | Admitting: Pulmonary Disease

## 2013-10-02 NOTE — Telephone Encounter (Signed)
Called and spoke with pt. She scheduled an appt to see TP tomorrow for acute visit. Nothing further needed

## 2013-10-03 ENCOUNTER — Ambulatory Visit (INDEPENDENT_AMBULATORY_CARE_PROVIDER_SITE_OTHER): Payer: Medicare HMO | Admitting: Adult Health

## 2013-10-03 ENCOUNTER — Encounter: Payer: Self-pay | Admitting: Adult Health

## 2013-10-03 VITALS — BP 126/74 | HR 64 | Temp 97.5°F | Ht 61.0 in | Wt 101.8 lb

## 2013-10-03 DIAGNOSIS — H01139 Eczematous dermatitis of unspecified eye, unspecified eyelid: Secondary | ICD-10-CM

## 2013-10-03 NOTE — Assessment & Plan Note (Signed)
Avoid using any makeup , mascara or eye shadow until gone.  Wash face with sensitive skin face soap and rinse good. Pat dry Change to sensitive skin detergent, soap, lotion.  Stop allergy eye drops.  May use Zyrtec 10mg  At bedtime  For 5 days , then daily As needed  Allergy symptoms.  Wash hands well and rinse , avoid touching eyes if possible.  Cold  compresses often to eye   Please contact office for sooner follow up if symptoms do not improve or worsen or seek emergency care  If not improving over next week will need to be referred to dermatology .

## 2013-10-03 NOTE — Patient Instructions (Signed)
Avoid using any makeup , mascara or eye shadow until gone.  Wash face with sensitive skin face soap and rinse good. Pat dry Change to sensitive skin detergent, soap, lotion.  Stop allergy eye drops.  May use Zyrtec 10mg At bedtime  For 5 days , then daily As needed  Allergy symptoms.  Wash hands well and rinse , avoid touching eyes if possible.  Cold  compresses often to eye   Please contact office for sooner follow up if symptoms do not improve or worsen or seek emergency care  If not improving over next week will need to be referred to dermatology .  

## 2013-10-03 NOTE — Progress Notes (Signed)
Subjective:    Patient ID: Alicia Dickson, female    DOB: [birth year], 73 y.o.   MRN: [id number]  HPI 73y/o WF  (Alicia Dickson)    10/03/2013 Acute OV  Presents for itchiness, redness, dryness around both eyes x1 week.  Has been using allergy eye drops, not helping.  Eyes are very sore and irritated.  No discharge, visual changes.  Has not smoked in 4 months-congratulated on cessation.  No recent travel or abx use.            Problem List:  GLAUCOMA (ICD-365.9) - on gtts per DrScott...  CIGARETTE SMOKER (ICD-305.1) - still smokes ~1-2cig/d now she says... advised to quit totally and offered Chantix (but she never filled Rx- too $$$)... she denies cough, sputum prod, hemoptysis, SOB, etc... ~  CXR 9/11 showed scarring right base w/ sl elev hemidiaph, NAD.Marland Kitchen. ~  CXR 9/12 showed stable elev right hemidiaph, sl incr markings, DJD sp, NAD.Marland Kitchen. ~  CXR 9/13 showed normal heart size, stable pleaural thickening right angle, NAD...  HYPERTENSION (ICD-401.9) - on PLENDIL 10mg /d... ~  [facility]/10: [facility] confirm that she isn't taking med regularly. ~  9/11:  pt states she is doing better & taking med more regularly. ~  9/12:  BP= 130/80 range on the Plendil daily... ~  9/13:  BP= 142/78 & she remains asymptomatic...  HYPERLIPIDEMIA (ICD-272.4) - on LIPITOR 40mg /d now & tol well... ~  [place] 4/06 on Lip20 showed TChol 208, TG 87, HDL 52, LDL 132... changed to Simva40. ~  [place] 4/08 on Simva40 showed TChol 221, TG 142, HDL 57, LDL 132... asked to incr to 80mg . ~  [place] 6/09 on Simva80 showed TChol 195, TG 95, HDL 57, LDL 119 ~  FLP 7/10 showed TChol 238, TG 90, HDL 57, LDL 160... last refilled ?12/09?> pt asked to take it regularly & call us for f/u labs on regular dose of med to assess her response (she never called for f/u FLP). ~  [place] 9/11 showed TChol 255, TG 90, HDL 64, LDL 169... call to Pharm indicated she was filling the Rx every other month (therefore on Simva40/d)... rec> change to  LIPITOR 40mg /d & call to check FLP after 1st of the yr (she never called). ~  [place] 9/12 on Lip40 showed TChol 178, TG 87, HDL 65, LDL 95... Continue same. ~  [place] 9/13 on Lip40 showed TChol 187, TG 117, HDL 55, LDL 109  GERD (ICD-530.81) - uses OTC meds as needed.  DIVERTICULOSIS OF COLON (ICD-562.10) & IRRITABLE BOWEL SYNDROME (ICD-564.1) >> +fam hx colon cancer in father who died at age 26...  ~  Colonoscopy 5/06 by DrPerry showed divertics only... f/u planned 11yrs due to +fam hx colon cancer... ~  Colonoscopy 11/11 by DrPerry showed severe divertics, sigmoid stenosis, no polyps; f/u planned 5 yrs & rec for stool softeners etc...  DEGENERATIVE JOINT DISEASE (ICD-715.90) > Eval of neck, right shoulder, & CTS by DrBlackman & Newton; she's had shots in neck & takes IBUPROFEN as needed... DrBlackman did XRays and MRI of her neck, etc... ~  9/13:  She reports "arthritis in my neck" & had 2 shots Dr "cute little guy" that really helped, we do not have records...  OSTEOPOROSIS (ICD-733.90) - BMD done by GYN... she was INTOL to Actonel, refuses to consider other bisphos or alternative Rx... she takes Caltrate (but stopped due to constip), Vits, Vit D (now 2000 u daily) according to the directions of DrLavoie. ~  BMD 4/08 by [facility] showed TScores -2.5 in Spine, & -1.7 in right FemNeck... ~  BMD 6/10 by [facility] showed TScores -2.8 in Spine, & -2.0 in righjt FemNeck... ~  Now due for f/u BMD & consideration of additional therapy for her osteoporosis; pt states this will be done by GYN.  ANXIETY (ICD-300.00) - off prev Wellbutrin Rx... Also prev Rx w/ Sharin Grave but she stopped this medication as well...  HEALTH MAINTENANCE:   ~  GI:  Followed by DrPerry & up to date... ~  GYN:  DrLavoie is her GYN & does BMDs in her office but pt is overdue 7 asked to inquire about this test... ~  Immuniz:  She gets the annual Flu vaccine...   Past Surgical History  Procedure Laterality Date  .  Appendectomy    . Total abdominal hysterectomy      Outpatient Encounter Prescriptions as of 10/03/2013  Medication Sig  . aspirin 81 MG tablet Take 81 mg by mouth daily.  Marland Kitchen atorvastatin (LIPITOR) 40 MG tablet Take 1 tablet (40 mg total) by mouth at bedtime.  . brimonidine (ALPHAGAN) 0.15 % ophthalmic solution Place 1 drop into both eyes 2 (two) times daily.  . cetirizine (ZYRTEC) 10 MG tablet Take 10 mg by mouth daily.  . Cholecalciferol (VITAMIN D) 2000 UNITS CAPS Take 1 capsule by mouth daily.    . Cyanocobalamin (VITAMIN B-12 PO) Take by mouth daily.  . felodipine (PLENDIL) 10 MG 24 hr tablet Take 1 tablet (10 mg total) by mouth daily.  Marland Kitchen ibuprofen (ADVIL,MOTRIN) 200 MG tablet Take 400 mg by mouth 2 (two) times daily as needed.   . mometasone (NASONEX) 50 MCG/ACT nasal spray Place 2 sprays into the nose daily.  . Multiple Vitamin (MULTIVITAMIN) tablet Take 1 tablet by mouth daily.    . Omega-3 Fatty Acids (FISH OIL) 1000 MG CAPS Take 1 capsule by mouth daily.  . sertraline (ZOLOFT) 50 MG tablet Take 1 tablet (50 mg total) by mouth daily.  . timolol (BETIMOL) 0.25 % ophthalmic solution Place 1 drop into both eyes 2 (two) times daily.      Allergies  Allergen Reactions  . Penicillins     REACTION: rash    Current Medications, Allergies, Past Medical History, Past Surgical History, Family History, and Social History were reviewed in [employer] record.    Review of Systems Constitutional:   No  weight loss, night sweats,  Fevers, chills, fatigue, or  lassitude.  HEENT:   No headaches,  Difficulty swallowing,  Tooth/dental problems, or  Sore throat,                No sneezing, itching, ear ache, nasal congestion, post nasal drip,   CV:  No chest pain,  Orthopnea, PND, swelling in lower extremities, anasarca, dizziness, palpitations, syncope.   GI  No heartburn, indigestion, abdominal pain, nausea, vomiting, diarrhea, change in bowel habits, loss of appetite,  bloody stools.   Resp: No shortness of breath with exertion or at rest.  No excess mucus, no productive cough,  No non-productive cough,  No coughing up of blood.  No change in color of mucus.  No wheezing.  No chest wall deformity  Skin: +eye irritation  GU: no dysuria, change in color of urine, no urgency or frequency.  No flank pain, no hematuria   MS:  No joint pain or swelling.  No decreased range of motion.  No back pain.  Psych:  No change in mood  or affect. No depression or anxiety.  No memory loss.        Objective:   Physical Exam     WD, petite, 73 y/o WF in NAD... GENERAL:  Alert & oriented; pleasant & cooperative... HEENT:  [place]/AT, EOM-wnl, PERRLA, EACs-clear, TMs-wnl, NOSE-clear, THROAT-clear & wnl.  Along both eyes with eyelid edema and surround erythema  Conjunctiva non-injected  NECK:  Supple w/ full ROM; no JVD; normal carotid impulses w/o bruits; no thyromegaly or nodules palpated; no lymphadenopathy. CHEST:  Clear to P & A; without wheezes/ rales/ or rhonchi. HEART:  Regular Rhythm; without murmurs/ rubs/ or gallops. ABDOMEN:  Soft & nontender; normal bowel sounds; no organomegaly or masses detected. EXT: without deformities, mild arthritic changes; no varicose veins/ venous insuffic/ or edema. NEURO:  no focal neuro deficits... DERM:  No rash

## 2013-11-21 ENCOUNTER — Telehealth: Payer: Self-pay | Admitting: Pulmonary Disease

## 2013-11-21 NOTE — Telephone Encounter (Signed)
Called spoke with pt. She scheduled appt to see TP tomorrow for eval.

## 2013-11-22 ENCOUNTER — Encounter: Payer: Self-pay | Admitting: Adult Health

## 2013-11-22 ENCOUNTER — Ambulatory Visit (INDEPENDENT_AMBULATORY_CARE_PROVIDER_SITE_OTHER): Payer: Medicare HMO | Admitting: Adult Health

## 2013-11-22 VITALS — BP 124/64 | HR 69 | Temp 97.7°F | Ht 61.0 in | Wt 104.6 lb

## 2013-11-22 DIAGNOSIS — R221 Localized swelling, mass and lump, neck: Secondary | ICD-10-CM

## 2013-11-22 DIAGNOSIS — K118 Other diseases of salivary glands: Secondary | ICD-10-CM

## 2013-11-22 DIAGNOSIS — R220 Localized swelling, mass and lump, head: Secondary | ICD-10-CM

## 2013-11-22 NOTE — Patient Instructions (Addendum)
We are going to refer you to ENT for Parotid swelling  Dr. Lenna Gilford is retiring and we will need to set you up with a new Primary Care provider.  Please let us know if you would like [place] to assist with this referral.   [facility] -- Please contact office for sooner follow up if symptoms do not improve or worsen or seek emergency care

## 2013-11-23 ENCOUNTER — Encounter: Payer: Self-pay | Admitting: Adult Health

## 2013-11-23 DIAGNOSIS — K118 Other diseases of salivary glands: Secondary | ICD-10-CM | POA: Insufficient documentation

## 2013-11-23 NOTE — Progress Notes (Signed)
Subjective:    Patient ID: Alicia Dickson, female    DOB: 01-28-1941, 73 y.o.   MRN: [id number]  HPI 73y/o WF  (Alicia Dickson)    11/23/2013 Acute OV  Complains of cyst on right side of face toward the ear - noticed on 3/22.  denies any pain, redness, drainage. No loss of hearing. No dry mouth or teeth pain.  Does feel it might be slightly smaller today . No redness and no tenderness to touch.    Has not smoked in 6 months-congratulated on cessation.  No recent travel or abx use.            Problem List:  GLAUCOMA (ICD-365.9) - on gtts per DrScott...  CIGARETTE SMOKER (ICD-305.1) - still smokes ~1-2cig/d now she says... advised to quit totally and offered Chantix (but she never filled Rx- too $$$)... she denies cough, sputum prod, hemoptysis, SOB, etc... ~  CXR 9/11 showed scarring right base w/ sl elev hemidiaph, NAD.Marland Kitchen. ~  CXR 9/12 showed stable elev right hemidiaph, sl incr markings, DJD sp, NAD.Marland Kitchen. ~  CXR 9/13 showed normal heart size, stable pleaural thickening right angle, NAD...  HYPERTENSION (ICD-401.9) - on PLENDIL 10mg /d... ~  [facility]: [facility] confirm that she isn't taking med regularly. ~  9/11:  pt states she is doing better & taking med more regularly. ~  9/12:  BP= 130/80 range on the Plendil daily... ~  9/13:  BP= 142/78 & she remains asymptomatic...  HYPERLIPIDEMIA (ICD-272.4) - on LIPITOR 40mg /d now & tol well... ~  [place] 4/06 on Lip20 showed TChol 208, TG 87, HDL 52, LDL 132... changed to Simva40. ~  [place] 4/08 on Simva40 showed TChol 221, TG 142, HDL 57, LDL 132... asked to incr to 80mg . ~  FL[place] 6/09 on Simva80 showed TChol 195, TG 95, HDL 57, LDL 119 ~  FLP 7/10 showed TChol 238, TG 90, HDL 57, LDL 160... last refilled ?12/09?> pt asked to take it regularly & call us for f/u labs on regular dose of med to assess her response (she never called for f/u FLP). ~  [place] 9/11 showed TChol 255, TG 90, HDL 64, LDL 169... call to Pharm indicated she was  filling the Rx every other month (therefore on Simva40/d)... rec> change to LIPITOR 40mg /d & call to check FLP after 1st of the yr (she never called). ~  [place] 9/12 on Lip40 showed TChol 178, TG 87, HDL 65, LDL 95... Continue same. ~  [place] 9/13 on Lip40 showed TChol 187, TG 117, HDL 55, LDL 109  GERD (ICD-530.81) - uses OTC meds as needed.  DIVERTICULOSIS OF COLON (ICD-562.10) & IRRITABLE BOWEL SYNDROME (ICD-564.1) >> +fam hx colon cancer in father who died at age 64...  ~  Colonoscopy 5/06 by DrPerry showed divertics only... f/u planned 79yrs due to +fam hx colon cancer... ~  Colonoscopy 11/11 by DrPerry showed severe divertics, sigmoid stenosis, no polyps; f/u planned 5 yrs & rec for stool softeners etc...  DEGENERATIVE JOINT DISEASE (ICD-715.90) > Eval of neck, right shoulder, & CTS by DrBlackman & Newton; she's had shots in neck & takes IBUPROFEN as needed... DrBlackman did XRays and MRI of her neck, etc... ~  9/13:  She reports "arthritis in my neck" & had 2 shots Dr "cute little guy" that really helped, we do not have records...  OSTEOPOROSIS (ICD-733.90) - BMD done by GYN... she was INTOL to Actonel, refuses to consider other bisphos or alternative Rx... she takes Caltrate (but  stopped due to constip), Vits, Vit D (now 2000 u daily) according to the directions of DrLavoie. ~  BMD 4/08 by [facility] showed TScores -2.5 in Spine, & -1.7 in right FemNeck... ~  BMD 6/10 by [facility] showed TScores -2.8 in Spine, & -2.0 in righjt FemNeck... ~  Now due for f/u BMD & consideration of additional therapy for her osteoporosis; pt states this will be done by GYN.  ANXIETY (ICD-300.00) - off prev Wellbutrin Rx... Also prev Rx w/ Sharin Grave but she stopped this medication as well...  HEALTH MAINTENANCE:   ~  GI:  Followed by DrPerry & up to date... ~  GYN:  DrLavoie is her GYN & does BMDs in her office but pt is overdue 7 asked to inquire about this test... ~  Immuniz:  She gets the annual Flu  vaccine...   Past Surgical History  Procedure Laterality Date  . Appendectomy    . Total abdominal hysterectomy      Outpatient Encounter Prescriptions as of 11/22/2013  Medication Sig  . aspirin 81 MG tablet Take 81 mg by mouth daily.  Marland Kitchen atorvastatin (LIPITOR) 40 MG tablet Take 1 tablet (40 mg total) by mouth at bedtime.  . brimonidine (ALPHAGAN) 0.15 % ophthalmic solution Place 1 drop into both eyes 2 (two) times daily.  . cetirizine (ZYRTEC) 10 MG tablet Take 10 mg by mouth daily.  . Cholecalciferol (VITAMIN D) 2000 UNITS CAPS Take 1 capsule by mouth daily.    . Cyanocobalamin (VITAMIN B-12 PO) Take by mouth daily.  . dorzolamide-timolol (COSOPT) 22.3-6.8 MG/ML ophthalmic solution Place 1 drop into both eyes 2 (two) times daily.  . felodipine (PLENDIL) 10 MG 24 hr tablet Take 1 tablet (10 mg total) by mouth daily.  Marland Kitchen ibuprofen (ADVIL,MOTRIN) 200 MG tablet Take 400 mg by mouth 2 (two) times daily as needed.   . mometasone (NASONEX) 50 MCG/ACT nasal spray Place 2 sprays into the nose daily.  . Multiple Vitamin (MULTIVITAMIN) tablet Take 1 tablet by mouth daily.    . Omega-3 Fatty Acids (FISH OIL) 1000 MG CAPS Take 1 capsule by mouth daily.  . sertraline (ZOLOFT) 50 MG tablet Take 1 tablet (50 mg total) by mouth daily.  . timolol (BETIMOL) 0.25 % ophthalmic solution Place 1 drop into both eyes 2 (two) times daily.      Allergies  Allergen Reactions  . Penicillins     REACTION: rash    Current Medications, Allergies, Past Medical History, Past Surgical History, Family History, and Social History were reviewed in [employer] record.    Review of Systems Constitutional:   No  weight loss, night sweats,  Fevers, chills, fatigue, or  lassitude.  HEENT:   No headaches,  Difficulty swallowing,  Tooth/dental problems, or  Sore throat,                No sneezing, itching, ear ache, nasal congestion, post nasal drip,   CV:  No chest pain,  Orthopnea, PND,  swelling in lower extremities, anasarca, dizziness, palpitations, syncope.   GI  No heartburn, indigestion, abdominal pain, nausea, vomiting, diarrhea, change in bowel habits, loss of appetite, bloody stools.   Resp: No shortness of breath with exertion or at rest.  No excess mucus, no productive cough,  No non-productive cough,  No coughing up of blood.  No change in color of mucus.  No wheezing.  No chest wall deformity  Skin: no reash  GU: no dysuria, change in color of  urine, no urgency or frequency.  No flank pain, no hematuria   MS:  No joint pain or swelling.  No decreased range of motion.  No back pain.  Psych:  No change in mood or affect. No depression or anxiety.  No memory loss.        Objective:   Physical Exam     WD, petite, 73 y/o WF in NAD... GENERAL:  Alert & oriented; pleasant & cooperative... HEENT:  [place]/AT, EOM-wnl, PERRLA, EACs-clear, TMs-wnl, NOSE-clear, THROAT-clear & wnl. Along preauricular /parotid area firm nodule/mass noted, fixed, nontender, no redness noted on  Right  ; left  side no palpable lesions noted.  Oral mucusa is clear , no obvious salivary stones noted along stensons duct   NECK:  Supple w/ full ROM; no JVD; normal carotid impulses w/o bruits; no thyromegaly or nodules palpated; no lymphadenopathy. CHEST:  Clear to P & A; without wheezes/ rales/ or rhonchi. HEART:  Regular Rhythm; without murmurs/ rubs/ or gallops. ABDOMEN:  Soft & nontender; normal bowel sounds; no organomegaly or masses detected. EXT: without deformities, mild arthritic changes; no varicose veins/ venous insuffic/ or edema. NEURO:  no focal neuro deficits... DERM:  No rash

## 2013-11-23 NOTE — Assessment & Plan Note (Signed)
Painless firm parotid mass on right ? Etiology   Plan  Refer to ENT .

## 2013-12-27 ENCOUNTER — Encounter: Payer: Self-pay | Admitting: Family Medicine

## 2013-12-27 ENCOUNTER — Telehealth: Payer: Self-pay | Admitting: Family Medicine

## 2013-12-27 ENCOUNTER — Ambulatory Visit (INDEPENDENT_AMBULATORY_CARE_PROVIDER_SITE_OTHER): Payer: Medicare HMO | Admitting: Family Medicine

## 2013-12-27 VITALS — BP 143/83 | HR 79 | Temp 99.0°F | Ht 61.0 in | Wt 103.0 lb

## 2013-12-27 DIAGNOSIS — R220 Localized swelling, mass and lump, head: Secondary | ICD-10-CM

## 2013-12-27 DIAGNOSIS — I10 Essential (primary) hypertension: Secondary | ICD-10-CM

## 2013-12-27 DIAGNOSIS — E785 Hyperlipidemia, unspecified: Secondary | ICD-10-CM

## 2013-12-27 DIAGNOSIS — F411 Generalized anxiety disorder: Secondary | ICD-10-CM

## 2013-12-27 DIAGNOSIS — K118 Other diseases of salivary glands: Secondary | ICD-10-CM

## 2013-12-27 DIAGNOSIS — R221 Localized swelling, mass and lump, neck: Secondary | ICD-10-CM

## 2013-12-27 DIAGNOSIS — K219 Gastro-esophageal reflux disease without esophagitis: Secondary | ICD-10-CM

## 2013-12-27 DIAGNOSIS — L259 Unspecified contact dermatitis, unspecified cause: Secondary | ICD-10-CM

## 2013-12-27 DIAGNOSIS — M1990 Unspecified osteoarthritis, unspecified site: Secondary | ICD-10-CM

## 2013-12-27 MED ORDER — METHYLPREDNISOLONE ACETATE 80 MG/ML IJ SUSP
120.0000 mg | Freq: Once | INTRAMUSCULAR | Status: AC
  Administered 2013-12-27: 120 mg via INTRAMUSCULAR

## 2013-12-27 NOTE — Progress Notes (Signed)
   Subjective:    Patient ID: Alicia Dickson, female    DOB: [birth year], 73 y.o.   MRN: [id number]  HPI 73 yr old female to establish after transferring from Dr. Lenna Gilford. She has been dealing with 2 issues lately. First in early April she had the sudden appearance of a mass on the right cheek and was referred to Dr. Melida Quitter. He felt this was probably an inflamed salivary gland and he gave her 10 days of Clindamycin. He saw her back on 12-12-13 and the mass had decreased in size. He will see her again in about 3 weeks to re-examine it. Also she has been struggling with itchy poison ivy all over since she was clearing out weeds in her yard a few days ago.    Review of Systems  Constitutional: Negative.   Respiratory: Negative.   Cardiovascular: Negative.   Skin: Positive for rash.       Objective:   Physical Exam  Constitutional: She appears well-developed and well-nourished.  HENT:  Firm mobile non-tender mass anterior to the right ear   Neck: No thyromegaly present.  Cardiovascular: Normal rate, regular rhythm, normal heart sounds and intact distal pulses.   Pulmonary/Chest: Effort normal and breath sounds normal.  Skin:  Widespread red excoriated papules           Assessment & Plan:  She will follow up with Dr. Redmond Baseman next month. Given a steroid shot today for the rash. She will plan on a cpx with [place] in September as usual.

## 2013-12-27 NOTE — Telephone Encounter (Signed)
Relevant patient education mailed to patient.  

## 2013-12-27 NOTE — Progress Notes (Signed)
Pre visit review using our clinic review tool, if applicable. No additional management support is needed unless otherwise documented below in the visit note. 

## 2013-12-27 NOTE — Addendum Note (Signed)
Addended by: Aggie Hacker A on: 12/27/2013 03:15 PM   Modules accepted: Orders

## 2014-02-01 ENCOUNTER — Other Ambulatory Visit: Payer: Self-pay | Admitting: Otolaryngology

## 2014-02-01 DIAGNOSIS — K118 Other diseases of salivary glands: Secondary | ICD-10-CM

## 2014-02-05 ENCOUNTER — Ambulatory Visit
Admission: RE | Admit: 2014-02-05 | Discharge: 2014-02-05 | Disposition: A | Discharge status: Home / Self Care | Payer: Medicare HMO | Source: Ambulatory Visit | Attending: Otolaryngology | Admitting: Otolaryngology

## 2014-02-05 DIAGNOSIS — K118 Other diseases of salivary glands: Secondary | ICD-10-CM

## 2014-02-05 MED ORDER — IOHEXOL 300 MG/ML  SOLN
75.0000 mL | Freq: Once | INTRAMUSCULAR | Status: AC | PRN
  Administered 2014-02-05: 75 mL via INTRAVENOUS

## 2014-03-13 ENCOUNTER — Encounter: Payer: Self-pay | Admitting: Family Medicine

## 2014-03-13 ENCOUNTER — Ambulatory Visit (INDEPENDENT_AMBULATORY_CARE_PROVIDER_SITE_OTHER): Payer: Medicare HMO | Admitting: Family Medicine

## 2014-03-13 VITALS — BP 177/97 | HR 74 | Temp 99.0°F | Ht 61.0 in | Wt 102.0 lb

## 2014-03-13 DIAGNOSIS — K118 Other diseases of salivary glands: Secondary | ICD-10-CM

## 2014-03-13 DIAGNOSIS — I10 Essential (primary) hypertension: Secondary | ICD-10-CM

## 2014-03-13 DIAGNOSIS — R221 Localized swelling, mass and lump, neck: Secondary | ICD-10-CM

## 2014-03-13 DIAGNOSIS — R220 Localized swelling, mass and lump, head: Secondary | ICD-10-CM

## 2014-03-13 MED ORDER — LOSARTAN POTASSIUM-HCTZ 50-12.5 MG PO TABS: 1.0000 | ORAL_TABLET | Freq: Every day | ORAL | Status: DC

## 2014-03-13 NOTE — Progress Notes (Signed)
Pre visit review using our clinic review tool, if applicable. No additional management support is needed unless otherwise documented below in the visit note. 

## 2014-03-13 NOTE — Progress Notes (Signed)
   Subjective:    Patient ID: Alicia Dickson, female    DOB: [birth year], 73 y.o.   MRN: [id number]  HPI Here to follow up. On 03-05-14 Dr. Redmond Baseman removed the mass that had been growing over the right parotid gland and this went well. I do not have an actual pathology report of this but she says it was benign. The wound is healing well. She feels well in general. Her BP at home has been elevated in the 140s over 80s. She notes that she has been on the same BP med for many years and asks if she could change this.    Review of Systems  Constitutional: Negative.   Respiratory: Negative.   Cardiovascular: Negative.        Objective:   Physical Exam  Constitutional: She appears well-developed and well-nourished.  HENT:  She has a surgical wound over the right parotid area which looks clean   Cardiovascular: Normal rate, regular rhythm, normal heart sounds and intact distal pulses.   Pulmonary/Chest: Effort normal and breath sounds normal.          Assessment & Plan:  We will stop Felodipine and switch to Losartan HCT 50-12.5 daily. Recheck in 2 months

## 2014-03-14 ENCOUNTER — Telehealth: Payer: Self-pay | Admitting: Family Medicine

## 2014-03-14 NOTE — Telephone Encounter (Signed)
Relevant patient education assigned to patient using Emmi. ° °

## 2014-04-09 ENCOUNTER — Encounter: Payer: Self-pay | Admitting: Family Medicine

## 2014-04-09 ENCOUNTER — Ambulatory Visit (INDEPENDENT_AMBULATORY_CARE_PROVIDER_SITE_OTHER): Payer: Medicare HMO | Admitting: Family Medicine

## 2014-04-09 VITALS — BP 173/89 | HR 81 | Temp 98.1°F | Ht 61.0 in | Wt 104.0 lb

## 2014-04-09 DIAGNOSIS — T65891A Toxic effect of other specified substances, accidental (unintentional), initial encounter: Secondary | ICD-10-CM

## 2014-04-09 DIAGNOSIS — T63444A Toxic effect of venom of bees, undetermined, initial encounter: Secondary | ICD-10-CM

## 2014-04-09 DIAGNOSIS — T6391XA Toxic effect of contact with unspecified venomous animal, accidental (unintentional), initial encounter: Secondary | ICD-10-CM

## 2014-04-09 MED ORDER — METHYLPREDNISOLONE ACETATE 80 MG/ML IJ SUSP
120.0000 mg | Freq: Once | INTRAMUSCULAR | Status: AC
  Administered 2014-04-09: 120 mg via INTRAMUSCULAR

## 2014-04-09 NOTE — Progress Notes (Signed)
   Subjective:    Patient ID: Alicia Dickson, female    DOB: 08/09/1941, 73 y.o.   MRN: [id number]  HPI Here for several yellow jacket stings on the hands which occurred yesterday as she worked in her yard. She has taken Benadryl and used ice packs. No SOB.    Review of Systems  Constitutional: Negative.   Respiratory: Negative.   Skin: Positive for wound.       Objective:   Physical Exam  Constitutional: She appears well-developed and well-nourished.  Cardiovascular: Normal rate, regular rhythm, normal heart sounds and intact distal pulses.   Pulmonary/Chest: Effort normal and breath sounds normal.  Musculoskeletal:  Both dorsal hands are swollen, warm, and tender          Assessment & Plan:  Local reaction to bee stings. Given a DepoMedrol shot. Recheck prn

## 2014-04-09 NOTE — Progress Notes (Signed)
Pre visit review using our clinic review tool, if applicable. No additional management support is needed unless otherwise documented below in the visit note. 

## 2014-05-09 ENCOUNTER — Encounter: Payer: Self-pay | Admitting: Family Medicine

## 2014-05-09 ENCOUNTER — Ambulatory Visit (INDEPENDENT_AMBULATORY_CARE_PROVIDER_SITE_OTHER): Payer: Medicare HMO | Admitting: Family Medicine

## 2014-05-09 VITALS — BP 156/89 | HR 73 | Temp 98.9°F | Ht 61.0 in | Wt 104.0 lb

## 2014-05-09 DIAGNOSIS — Z23 Encounter for immunization: Secondary | ICD-10-CM

## 2014-05-09 DIAGNOSIS — I10 Essential (primary) hypertension: Secondary | ICD-10-CM

## 2014-05-09 MED ORDER — LOSARTAN POTASSIUM-HCTZ 100-25 MG PO TABS: 1.0000 | ORAL_TABLET | Freq: Every day | ORAL | Status: DC

## 2014-05-09 NOTE — Progress Notes (Signed)
Pre visit review using our clinic review tool, if applicable. No additional management support is needed unless otherwise documented below in the visit note. 

## 2014-05-09 NOTE — Progress Notes (Signed)
   Subjective:    Patient ID: Alicia Dickson, female    DOB: 07-13-1941, 73 y.o.   MRN: [id number]  HPI Here to follow up HTN. She started on Hyzaar 2 months ago and she has tolerated it well, but her BP has not come down very much. She has been getting readings of 150s and 160s over 90s at home. She feels fine.    Review of Systems  Constitutional: Negative.   Respiratory: Negative.   Cardiovascular: Negative.   Neurological: Negative.        Objective:   Physical Exam  Constitutional: She is oriented to person, place, and time. She appears well-developed and well-nourished.  Cardiovascular: Normal rate, regular rhythm, normal heart sounds and intact distal pulses.   Pulmonary/Chest: Effort normal and breath sounds normal.  Neurological: She is alert and oriented to person, place, and time.          Assessment & Plan:  We will increase the Hyzaar to 100-25 to take daily. Recheck one month

## 2014-05-24 ENCOUNTER — Ambulatory Visit: Payer: Medicare Other | Admitting: Pulmonary Disease

## 2014-05-28 ENCOUNTER — Other Ambulatory Visit (INDEPENDENT_AMBULATORY_CARE_PROVIDER_SITE_OTHER): Payer: Medicare HMO

## 2014-05-28 DIAGNOSIS — Z0000 Encounter for general adult medical examination without abnormal findings: Secondary | ICD-10-CM

## 2014-05-28 DIAGNOSIS — E785 Hyperlipidemia, unspecified: Secondary | ICD-10-CM

## 2014-05-28 LAB — POCT URINALYSIS DIPSTICK
Bilirubin, UA: NEGATIVE
Glucose, UA: NEGATIVE
KETONES UA: NEGATIVE
Leukocytes, UA: NEGATIVE
Nitrite, UA: NEGATIVE
PH UA: 7
Protein, UA: NEGATIVE
RBC UA: NEGATIVE
SPEC GRAV UA: 1.015
UROBILINOGEN UA: 0.2

## 2014-05-28 LAB — BASIC METABOLIC PANEL
BUN: 21 mg/dL (ref 6–23)
CALCIUM: 9.1 mg/dL (ref 8.4–10.5)
CHLORIDE: 93 meq/L — AB (ref 96–112)
CO2: 25 mEq/L (ref 19–32)
Creatinine, Ser: 0.7 mg/dL (ref 0.4–1.2)
GFR: 88.23 mL/min (ref >60.00)
Glucose, Bld: 83 mg/dL (ref 70–99)
Potassium: 4.5 mEq/L (ref 3.5–5.1)
SODIUM: 127 meq/L — AB (ref 135–145)

## 2014-05-28 LAB — HEPATIC FUNCTION PANEL
ALT: 16 U/L (ref 0–35)
AST: 24 U/L (ref 0–37)
Albumin: 4.1 g/dL (ref 3.5–5.2)
Alkaline Phosphatase: 70 U/L (ref 39–117)
BILIRUBIN DIRECT: 0 mg/dL (ref 0.0–0.3)
TOTAL PROTEIN: 6.7 g/dL (ref 6.0–8.3)
Total Bilirubin: 0.7 mg/dL (ref 0.2–1.2)

## 2014-05-28 LAB — CBC WITH DIFFERENTIAL/PLATELET
BASOS PCT: 0.4 % (ref 0.0–3.0)
Basophils Absolute: 0 10*3/uL (ref 0.0–0.1)
EOS ABS: 0.1 10*3/uL (ref 0.0–0.7)
EOS PCT: 1.1 % (ref 0.0–5.0)
HCT: 38.9 % (ref 36.0–46.0)
Hemoglobin: 13.2 g/dL (ref 12.0–15.0)
LYMPHS PCT: 27.2 % (ref 12.0–46.0)
Lymphs Abs: 1.8 10*3/uL (ref 0.7–4.0)
MCHC: 34 g/dL (ref 30.0–36.0)
MCV: 92.9 fl (ref 78.0–100.0)
Monocytes Absolute: 0.5 10*3/uL (ref 0.1–1.0)
Monocytes Relative: 7.7 % (ref 3.0–12.0)
NEUTROS ABS: 4.3 10*3/uL (ref 1.4–7.7)
Neutrophils Relative %: 63.6 % (ref 43.0–77.0)
Platelets: 348 10*3/uL (ref 150.0–400.0)
RBC: 4.19 Mil/uL (ref 3.87–5.11)
RDW: 12 % (ref 11.5–15.5)
WBC: 6.8 10*3/uL (ref 4.0–10.5)

## 2014-05-28 LAB — LIPID PANEL
Cholesterol: 256 mg/dL — ABNORMAL HIGH (ref 0–200)
HDL: 72.2 mg/dL (ref >39.00)
LDL CALC: 164 mg/dL — AB (ref 0–99)
NonHDL: 183.8
Total CHOL/HDL Ratio: 4
Triglycerides: 99 mg/dL (ref 0.0–149.0)
VLDL: 19.8 mg/dL (ref 0.0–40.0)

## 2014-05-28 LAB — TSH: TSH: 2.03 u[IU]/mL (ref 0.35–4.50)

## 2014-06-04 ENCOUNTER — Encounter: Payer: Self-pay | Admitting: Family Medicine

## 2014-06-04 ENCOUNTER — Ambulatory Visit (INDEPENDENT_AMBULATORY_CARE_PROVIDER_SITE_OTHER): Payer: Medicare HMO | Admitting: Family Medicine

## 2014-06-04 VITALS — BP 128/81 | HR 69 | Temp 98.0°F | Ht 61.0 in | Wt 104.0 lb

## 2014-06-04 DIAGNOSIS — E785 Hyperlipidemia, unspecified: Secondary | ICD-10-CM

## 2014-06-04 DIAGNOSIS — M949 Disorder of cartilage, unspecified: Secondary | ICD-10-CM

## 2014-06-04 DIAGNOSIS — Z0000 Encounter for general adult medical examination without abnormal findings: Secondary | ICD-10-CM

## 2014-06-04 DIAGNOSIS — M899 Disorder of bone, unspecified: Secondary | ICD-10-CM

## 2014-06-04 MED ORDER — ROSUVASTATIN CALCIUM 10 MG PO TABS: 10.0000 mg | ORAL_TABLET | Freq: Every day | ORAL | Status: DC

## 2014-06-04 NOTE — Progress Notes (Signed)
   Subjective:    Patient ID: Alicia Dickson, female    DOB: 09/04/1940, 73 y.o.   MRN: [id number]  HPI 73 yr old female for a cpx. She feels fine. She has tried Lipitor and Zocor in the past but had to stop them due to myalgias. Her recent labs show her LDL has gone back up to 164 despite a strict diet.    Review of Systems  Constitutional: Negative.   HENT: Negative.   Eyes: Negative.   Respiratory: Negative.   Cardiovascular: Negative.   Gastrointestinal: Negative.   Genitourinary: Negative for dysuria, urgency, frequency, hematuria, flank pain, decreased urine volume, enuresis, difficulty urinating, pelvic pain and dyspareunia.  Musculoskeletal: Negative.   Skin: Negative.   Neurological: Negative.   Psychiatric/Behavioral: Negative.        Objective:   Physical Exam  Constitutional: She is oriented to person, place, and time. She appears well-developed and well-nourished. No distress.  HENT:  Head: Normocephalic and atraumatic.  Right Ear: External ear normal.  Left Ear: External ear normal.  Nose: Nose normal.  Mouth/Throat: Oropharynx is clear and moist. No oropharyngeal exudate.  Eyes: Conjunctivae and EOM are normal. Pupils are equal, round, and reactive to light. No scleral icterus.  Neck: Normal range of motion. Neck supple. No JVD present. No thyromegaly present.  Cardiovascular: Normal rate, regular rhythm, normal heart sounds and intact distal pulses.  Exam reveals no gallop and no friction rub.   No murmur heard. EKG normal   Pulmonary/Chest: Effort normal and breath sounds normal. No respiratory distress. She has no wheezes. She has no rales. She exhibits no tenderness.  Abdominal: Soft. Bowel sounds are normal. She exhibits no distension and no mass. There is no tenderness. There is no rebound and no guarding.  Musculoskeletal: Normal range of motion. She exhibits no edema and no tenderness.  Lymphadenopathy:    She has no cervical adenopathy.  Neurological:  She is alert and oriented to person, place, and time. She has normal reflexes. No cranial nerve deficit. She exhibits normal muscle tone. Coordination normal.  Skin: Skin is warm and dry. No rash noted. No erythema.  Psychiatric: She has a normal mood and affect. Her behavior is normal. Judgment and thought content normal.          Assessment & Plan:  Well exam. We will set up another DEXA. Try Crestor 10 mg daily for the lipids. Reminded her to get another mammogram.

## 2014-06-04 NOTE — Progress Notes (Signed)
Pre visit review using our clinic review tool, if applicable. No additional management support is needed unless otherwise documented below in the visit note. 

## 2014-07-10 ENCOUNTER — Encounter: Payer: Self-pay | Admitting: Internal Medicine

## 2014-10-10 ENCOUNTER — Ambulatory Visit (INDEPENDENT_AMBULATORY_CARE_PROVIDER_SITE_OTHER): Payer: Medicare HMO | Admitting: Family Medicine

## 2014-10-10 ENCOUNTER — Encounter: Payer: Self-pay | Admitting: Family Medicine

## 2014-10-10 VITALS — BP 143/85 | HR 83 | Temp 98.2°F | Ht 61.0 in | Wt 106.0 lb

## 2014-10-10 DIAGNOSIS — E785 Hyperlipidemia, unspecified: Secondary | ICD-10-CM

## 2014-10-10 DIAGNOSIS — L309 Dermatitis, unspecified: Secondary | ICD-10-CM

## 2014-10-10 MED ORDER — BETAMETHASONE DIPROPIONATE AUG 0.05 % EX CREA: TOPICAL_CREAM | Freq: Two times a day (BID) | CUTANEOUS | Status: DC

## 2014-10-10 NOTE — Progress Notes (Signed)
   Subjective:    Patient ID: Alicia Dickson, female    DOB: 12/15/1940, 74 y.o.   MRN: [id number]  HPI Here to check an itchy rash on the arms, chest, and back. She has eczema but it has never bothered her this much before. Using 1% hydrocortisone cream and taking Zyrtec once a day.    Review of Systems  Constitutional: Negative.   Skin: Positive for rash.       Objective:   Physical Exam  Constitutional: She appears well-developed and well-nourished.  Skin:  Scattered erythematous papules as above           Assessment & Plan:  This is eczema. Increase the Zyrtec to BID and switch to Diprolene AF cream bid.

## 2014-10-10 NOTE — Progress Notes (Signed)
Pre visit review using our clinic review tool, if applicable. No additional management support is needed unless otherwise documented below in the visit note. 

## 2014-10-16 ENCOUNTER — Other Ambulatory Visit (INDEPENDENT_AMBULATORY_CARE_PROVIDER_SITE_OTHER): Payer: Medicare HMO

## 2014-10-16 DIAGNOSIS — E785 Hyperlipidemia, unspecified: Secondary | ICD-10-CM

## 2014-10-16 LAB — LIPID PANEL
CHOLESTEROL: 172 mg/dL (ref 0–200)
HDL: 66.2 mg/dL (ref >39.00)
LDL Cholesterol: 86 mg/dL (ref 0–99)
NonHDL: 105.8
TRIGLYCERIDES: 97 mg/dL (ref 0.0–149.0)
Total CHOL/HDL Ratio: 3
VLDL: 19.4 mg/dL (ref 0.0–40.0)

## 2014-10-16 LAB — HEPATIC FUNCTION PANEL
ALBUMIN: 4.1 g/dL (ref 3.5–5.2)
ALT: 15 U/L (ref 0–35)
AST: 23 U/L (ref 0–37)
Alkaline Phosphatase: 77 U/L (ref 39–117)
Bilirubin, Direct: 0.1 mg/dL (ref 0.0–0.3)
TOTAL PROTEIN: 6.7 g/dL (ref 6.0–8.3)
Total Bilirubin: 0.4 mg/dL (ref 0.2–1.2)

## 2015-03-02 ENCOUNTER — Encounter (HOSPITAL_COMMUNITY): Payer: Self-pay | Admitting: Emergency Medicine

## 2015-03-02 ENCOUNTER — Inpatient Hospital Stay (HOSPITAL_COMMUNITY)
Admission: EM | Admit: 2015-03-02 | Discharge: 2015-03-04 | DRG: 392 | Disposition: A | Discharge status: Home / Self Care | Payer: Medicare HMO | Attending: Internal Medicine | Admitting: Internal Medicine

## 2015-03-02 ENCOUNTER — Emergency Department (HOSPITAL_COMMUNITY): Payer: Medicare HMO

## 2015-03-02 DIAGNOSIS — E876 Hypokalemia: Secondary | ICD-10-CM | POA: Diagnosis not present

## 2015-03-02 DIAGNOSIS — K219 Gastro-esophageal reflux disease without esophagitis: Secondary | ICD-10-CM | POA: Diagnosis present

## 2015-03-02 DIAGNOSIS — K529 Noninfective gastroenteritis and colitis, unspecified: Secondary | ICD-10-CM | POA: Diagnosis not present

## 2015-03-02 DIAGNOSIS — H409 Unspecified glaucoma: Secondary | ICD-10-CM | POA: Diagnosis present

## 2015-03-02 DIAGNOSIS — Z720 Tobacco use: Secondary | ICD-10-CM | POA: Diagnosis not present

## 2015-03-02 DIAGNOSIS — E871 Hypo-osmolality and hyponatremia: Secondary | ICD-10-CM | POA: Diagnosis present

## 2015-03-02 DIAGNOSIS — Z90710 Acquired absence of both cervix and uterus: Secondary | ICD-10-CM

## 2015-03-02 DIAGNOSIS — R109 Unspecified abdominal pain: Secondary | ICD-10-CM

## 2015-03-02 DIAGNOSIS — E785 Hyperlipidemia, unspecified: Secondary | ICD-10-CM | POA: Diagnosis present

## 2015-03-02 DIAGNOSIS — Z8249 Family history of ischemic heart disease and other diseases of the circulatory system: Secondary | ICD-10-CM

## 2015-03-02 DIAGNOSIS — Z79899 Other long term (current) drug therapy: Secondary | ICD-10-CM

## 2015-03-02 DIAGNOSIS — A09 Infectious gastroenteritis and colitis, unspecified: Secondary | ICD-10-CM | POA: Diagnosis present

## 2015-03-02 DIAGNOSIS — Z66 Do not resuscitate: Secondary | ICD-10-CM | POA: Diagnosis present

## 2015-03-02 DIAGNOSIS — I10 Essential (primary) hypertension: Secondary | ICD-10-CM | POA: Diagnosis not present

## 2015-03-02 DIAGNOSIS — Z7982 Long term (current) use of aspirin: Secondary | ICD-10-CM

## 2015-03-02 DIAGNOSIS — Z800 Family history of malignant neoplasm of digestive organs: Secondary | ICD-10-CM

## 2015-03-02 DIAGNOSIS — E860 Dehydration: Secondary | ICD-10-CM | POA: Diagnosis present

## 2015-03-02 DIAGNOSIS — F17200 Nicotine dependence, unspecified, uncomplicated: Secondary | ICD-10-CM | POA: Diagnosis present

## 2015-03-02 LAB — CBC WITH DIFFERENTIAL/PLATELET
BASOS PCT: 0 % (ref 0–1)
Basophils Absolute: 0 10*3/uL (ref 0.0–0.1)
EOS ABS: 0 10*3/uL (ref 0.0–0.7)
Eosinophils Relative: 0 % (ref 0–5)
HCT: 41.8 % (ref 36.0–46.0)
Hemoglobin: 14.8 g/dL (ref 12.0–15.0)
Lymphocytes Relative: 5 % — ABNORMAL LOW (ref 12–46)
Lymphs Abs: 0.9 10*3/uL (ref 0.7–4.0)
MCH: 30.5 pg (ref 26.0–34.0)
MCHC: 35.4 g/dL (ref 30.0–36.0)
MCV: 86.2 fL (ref 78.0–100.0)
MONO ABS: 0.4 10*3/uL (ref 0.1–1.0)
Monocytes Relative: 2 % — ABNORMAL LOW (ref 3–12)
NEUTROS PCT: 93 % — AB (ref 43–77)
Neutro Abs: 17.4 10*3/uL — ABNORMAL HIGH (ref 1.7–7.7)
PLATELETS: 305 10*3/uL (ref 150–400)
RBC: 4.85 MIL/uL (ref 3.87–5.11)
RDW: 11.9 % (ref 11.5–15.5)
WBC: 18.7 10*3/uL — AB (ref 4.0–10.5)

## 2015-03-02 LAB — COMPREHENSIVE METABOLIC PANEL
ALT: 26 U/L (ref 14–54)
AST: 38 U/L (ref 15–41)
Albumin: 4.5 g/dL (ref 3.5–5.0)
Alkaline Phosphatase: 103 U/L (ref 38–126)
Anion gap: 15 (ref 5–15)
BILIRUBIN TOTAL: 0.9 mg/dL (ref 0.3–1.2)
BUN: 28 mg/dL — AB (ref 6–20)
CO2: 23 mmol/L (ref 22–32)
CREATININE: 0.89 mg/dL (ref 0.44–1.00)
Calcium: 9.1 mg/dL (ref 8.9–10.3)
Chloride: 94 mmol/L — ABNORMAL LOW (ref 101–111)
GFR calc Af Amer: >60 mL/min (ref >60)
GLUCOSE: 146 mg/dL — AB (ref 65–99)
Potassium: 3.3 mmol/L — ABNORMAL LOW (ref 3.5–5.1)
SODIUM: 132 mmol/L — AB (ref 135–145)
Total Protein: 7.5 g/dL (ref 6.5–8.1)

## 2015-03-02 MED ORDER — IOHEXOL 300 MG/ML  SOLN
100.0000 mL | Freq: Once | INTRAMUSCULAR | Status: AC | PRN
  Administered 2015-03-02: 100 mL via INTRAVENOUS

## 2015-03-02 MED ORDER — OXYCODONE HCL 5 MG PO TABS: 5.0000 mg | ORAL_TABLET | ORAL | Status: DC | PRN

## 2015-03-02 MED ORDER — SODIUM CHLORIDE 0.9 % IV SOLN
INTRAVENOUS | Status: AC
Start: 2015-03-02 — End: 2015-03-03
  Administered 2015-03-02: 18:00:00 via INTRAVENOUS

## 2015-03-02 MED ORDER — SODIUM CHLORIDE 0.9 % IV BOLUS (SEPSIS)
2000.0000 mL | Freq: Once | INTRAVENOUS | Status: AC
  Administered 2015-03-02: 2000 mL via INTRAVENOUS

## 2015-03-02 MED ORDER — SODIUM CHLORIDE 0.9 % IV SOLN
INTRAVENOUS | Status: DC
  Administered 2015-03-02 – 2015-03-04 (×4): via INTRAVENOUS

## 2015-03-02 MED ORDER — IOHEXOL 300 MG/ML  SOLN
50.0000 mL | Freq: Once | INTRAMUSCULAR | Status: AC | PRN
  Administered 2015-03-02: 50 mL via ORAL

## 2015-03-02 MED ORDER — ONDANSETRON HCL 4 MG PO TABS: 4.0000 mg | ORAL_TABLET | Freq: Four times a day (QID) | ORAL | Status: DC | PRN

## 2015-03-02 MED ORDER — ACETAMINOPHEN 650 MG RE SUPP: 650.0000 mg | Freq: Four times a day (QID) | RECTAL | Status: DC | PRN

## 2015-03-02 MED ORDER — BRIMONIDINE TARTRATE 0.15 % OP SOLN
1.0000 [drp] | Freq: Two times a day (BID) | OPHTHALMIC | Status: DC
  Administered 2015-03-02 – 2015-03-04 (×4): 1 [drp] via OPHTHALMIC
  Filled 2015-03-02: qty 5

## 2015-03-02 MED ORDER — ACETAMINOPHEN 325 MG PO TABS: 650.0000 mg | ORAL_TABLET | Freq: Four times a day (QID) | ORAL | Status: DC | PRN

## 2015-03-02 MED ORDER — CIPROFLOXACIN IN D5W 400 MG/200ML IV SOLN
400.0000 mg | Freq: Once | INTRAVENOUS | Status: AC
  Administered 2015-03-02: 400 mg via INTRAVENOUS
  Filled 2015-03-02: qty 200

## 2015-03-02 MED ORDER — POTASSIUM CHLORIDE 10 MEQ/100ML IV SOLN
10.0000 meq | Freq: Once | INTRAVENOUS | Status: AC
  Administered 2015-03-02: 10 meq via INTRAVENOUS
  Filled 2015-03-02: qty 100

## 2015-03-02 MED ORDER — ONDANSETRON HCL 4 MG/2ML IJ SOLN
4.0000 mg | Freq: Four times a day (QID) | INTRAMUSCULAR | Status: DC | PRN
  Administered 2015-03-02: 4 mg via INTRAVENOUS
  Filled 2015-03-02: qty 2

## 2015-03-02 MED ORDER — METRONIDAZOLE IN NACL 5-0.79 MG/ML-% IV SOLN
500.0000 mg | Freq: Three times a day (TID) | INTRAVENOUS | Status: DC
  Administered 2015-03-02 – 2015-03-04 (×5): 500 mg via INTRAVENOUS
  Filled 2015-03-02 (×6): qty 100

## 2015-03-02 MED ORDER — METRONIDAZOLE IN NACL 5-0.79 MG/ML-% IV SOLN
500.0000 mg | Freq: Once | INTRAVENOUS | Status: AC
  Administered 2015-03-02: 500 mg via INTRAVENOUS
  Filled 2015-03-02: qty 100

## 2015-03-02 MED ORDER — CIPROFLOXACIN IN D5W 400 MG/200ML IV SOLN
400.0000 mg | Freq: Two times a day (BID) | INTRAVENOUS | Status: DC
  Administered 2015-03-03 – 2015-03-04 (×3): 400 mg via INTRAVENOUS
  Filled 2015-03-02 (×4): qty 200

## 2015-03-02 MED ORDER — DORZOLAMIDE HCL-TIMOLOL MAL 2-0.5 % OP SOLN
1.0000 [drp] | Freq: Two times a day (BID) | OPHTHALMIC | Status: DC
  Administered 2015-03-02 – 2015-03-04 (×4): 1 [drp] via OPHTHALMIC
  Filled 2015-03-02: qty 10

## 2015-03-02 NOTE — H&P (Signed)
History and Physical  Alicia Dickson:[id number] DOB: 1941/08/27 DOA: 03/02/2015  Referring physician: Hoover Brunette, ER physician PCP: Laurey Morale, MD   Chief Complaint: Nausea vomiting and diarrhea  HPI: Alicia Dickson is a 74 y.o. female  With past mental history of tobacco abuse, hypertension and hyperlipidemia was in her usual state of health when she started having sudden onset nausea vomiting and diarrhea starting the night of 7/1. Symptoms persisted. Patient thought that she had seen a lot of blood in her stool. This was confirmed by her daughter. Symptoms not resolving, patient was brought into the emergency room. There she was noted to have be mildly dehydrated, have a white blood cell count of 18 and a CT scan consistent with left-sided colitis, infectious more likely than ischemic. Patient given Cipro and Flagyl IV and hospitalist called for further evaluation.   Review of Systems:  Patient seen after arrival to floor. Pt complains of feeling very tired. She feels less nauseated and is actually somewhat hungry. Still complains of some diarrhea with blood  Pt denies any headaches, vision changes, dysphagia, chest pain, palpitations, shortness of breath, no abdominal pain-patient has never really complained of abdominal pain, hematuria, dysuria, constipation, focal extremity numbness weakness or pain .  Review of systems are otherwise negative  Past Medical History  Diagnosis Date  . Cellulitis of right leg   . Glaucoma   . Cigarette smoker   . Hypertension   . Hyperlipidemia   . GERD (gastroesophageal reflux disease)   . Diverticulosis of colon   . IBS (irritable bowel syndrome)   . DJD (degenerative joint disease)   . Osteopenia   . Anxiety    Past Surgical History  Procedure Laterality Date  . Appendectomy    . Total abdominal hysterectomy    . Colonoscopy  07-02-10    per Dr. Henrene Pastor, severe diverticulosis, no polyps, repeat in 5 yrs    Social History:   reports that she quit smoking about 21 months ago. Her smoking use included Cigarettes. She has a 10 pack-year smoking history. She has never used smokeless tobacco. She reports that she drinks alcohol. She reports that she does not use illicit drugs. Patient lives at home by herself & is able to participate in activities of daily living with out assistance. She is quite active, practices yoga  Allergies  Allergen Reactions  . Penicillins     REACTION: rash    Family History  Problem Relation Age of Onset  . Colon cancer Father   . Hypertension Mother   . Hyperlipidemia Mother       Prior to Admission medications   Medication Sig Start Date End Date Taking? Authorizing Provider  aspirin 81 MG tablet Take 81 mg by mouth daily.   Yes Historical Provider, MD  augmented betamethasone dipropionate (DIPROLENE-AF) 0.05 % cream Apply topically 2 (two) times daily. 10/10/14  Yes Laurey Morale, MD  brimonidine (ALPHAGAN) 0.15 % ophthalmic solution Place 1 drop into both eyes 2 (two) times daily.   Yes Historical Provider, MD  cetirizine (ZYRTEC) 10 MG tablet Take 10 mg by mouth daily.   Yes Historical Provider, MD  Cholecalciferol (VITAMIN D) 2000 UNITS CAPS Take 1 capsule by mouth daily.     Yes Historical Provider, MD  Cyanocobalamin (VITAMIN B-12 PO) Take by mouth daily.   Yes Historical Provider, MD  dorzolamide-timolol (COSOPT) 22.3-6.8 MG/ML ophthalmic solution Place 1 drop into both eyes 2 (two) times daily. 11/14/13  Yes Historical Provider,  MD  ibuprofen (ADVIL,MOTRIN) 200 MG tablet Take 400 mg by mouth 2 (two) times daily as needed for mild pain.    Yes Historical Provider, MD  losartan-hydrochlorothiazide (HYZAAR) 100-25 MG per tablet Take 1 tablet by mouth daily. 05/09/14  Yes Laurey Morale, MD  Multiple Vitamin (MULTIVITAMIN) tablet Take 1 tablet by mouth daily.     Yes Historical Provider, MD  rosuvastatin (CRESTOR) 10 MG tablet Take 1 tablet (10 mg total) by mouth daily. 06/04/14  Yes  Laurey Morale, MD    Physical Exam: BP 115/61 mmHg  Pulse 109  Temp(Src) 98 F (36.7 C) (Oral)  Resp 18  SpO2 99%  General:  Alert and oriented 3, fatigued Eyes: Sclera nonicteric, extraocular movements are intact ENT: Normocephalic and atraumatic, mucous membranes are dry Neck: No JVD Cardiovascular: Regular rhythm, borderline tachycardia Respiratory: Clear to auscultation bilaterally Abdomen: Soft, nondistended, minimal tenderness on left side, hypoactive bowel sounds Skin: No skin breaks, tears or lesions Musculoskeletal: No clubbing or cyanosis or edema Psychiatric: Patient is appropriate, no evidence of psychoses Neurologic: No focal deficits           Labs on Admission:  Basic Metabolic Panel:  Recent Labs Lab 03/02/15 1235  NA 132*  K 3.3*  CL 94*  CO2 23  GLUCOSE 146*  BUN 28*  CREATININE 0.89  CALCIUM 9.1   Liver Function Tests:  Recent Labs Lab 03/02/15 1235  AST 38  ALT 26  ALKPHOS 103  BILITOT 0.9  PROT 7.5  ALBUMIN 4.5   No results for input(s): LIPASE, AMYLASE in the last 168 hours. No results for input(s): AMMONIA in the last 168 hours. CBC:  Recent Labs Lab 03/02/15 1235  WBC 18.7*  NEUTROABS 17.4*  HGB 14.8  HCT 41.8  MCV 86.2  PLT 305   Cardiac Enzymes: No results for input(s): CKTOTAL, CKMB, CKMBINDEX, TROPONINI in the last 168 hours.  BNP (last 3 results) No results for input(s): BNP in the last 8760 hours.  ProBNP (last 3 results) No results for input(s): PROBNP in the last 8760 hours.  CBG: No results for input(s): GLUCAP in the last 168 hours.  Radiological Exams on Admission: Ct Abdomen Pelvis W Contrast  03/02/2015   CLINICAL DATA:  Nausea, vomiting, diarrhea with bloody stools and lower abdomen pain for 1 day.  EXAM: CT ABDOMEN AND PELVIS WITH CONTRAST  TECHNIQUE: Multidetector CT imaging of the abdomen and pelvis was performed using the standard protocol following bolus administration of intravenous contrast.   CONTRAST:  80mL OMNIPAQUE IOHEXOL 300 MG/ML SOLN, 126mL OMNIPAQUE IOHEXOL 300 MG/ML SOLN  COMPARISON:  None.  FINDINGS: Several liver cysts are identified. The liver is otherwise normal. The gallbladder is normal. The spleen, pancreas, adrenal glands and left kidney are normal. There are simple cysts within the right kidney.  Images of the bowel demonstrate diffuse bowel wall thickening involving the splenic flexure colon, descending colon, sigmoid colon with surrounding inflammation. There is no evidence of bowel necrosis. There is no small bowel obstruction. The appendix is not seen but no inflammation is noted around cecum.  There is atherosclerosis of the abdominal aorta. The origin of the celiac artery, superior mesenteric artery, inferior mesenteric artery are contrast opacified. There is no abdominal aortic aneurysm.  Fluid-filled bladder is normal. Minimal free fluid is identified in the pelvis. No acute abnormalities identified in the visualized bones.  IMPRESSION: Diffuse bowel wall thickening involving the splenic flexure colon, descending colon, sigmoid colon with surrounding inflammation. Favor  infectious inflammatory etiology. Although the area of abnormality is in the inferior mesenteric artery vascular territory, contrast opacified blood flow was identified in the margins of inferior mesenteric artery. There is no evidence of bowel necrosis.   Electronically Signed   By: Abelardo Diesel M.D.   On: 03/02/2015 16:09    EKG: Not done  Assessment/Plan Present on Admission:  . Glaucoma: Continue drops  . Essential hypertension: Holding home medications given dehydration. ,  . Hyperlipidemia: Holding statin while dehydrated.  . Colitis, infectious with hematochezia: Principal problem. Hydrating. Clear liquid diet, advance as tolerated. IV Cipro and Flagyl for gut coverage.  Do not think this is an acute GI bleed, hemoglobin stable for now. Expect some drop given dehydration. If blood levels  continues to drop, will discuss with GI, but for now this appears to be straightforward infectious colitis   . CIGARETTE SMOKER: Patient declines nicotine patch . Hypokalemia: Replace as needed. Secondary dehydration  . Hyponatremia: Secondary dehydration, should improve with IV fluids   Consultants: None  Code Status: DO NOT RESUSCITATE as confirmed by patient and daughter  Family Communication: Daughter at the bedside   Disposition Plan: Home once white count normalizes and patient tolerating solid food, likely discharge in next 1-2 days  Time spent: 40 minutes  [facility] Pager [phone]

## 2015-03-02 NOTE — ED Notes (Signed)
Pt reported n/v/d with bloody stools, hematuria? and lower abd pain. N/V x15 and diarrhea x 15 in 24 hrs. Abd soft/nondistended but tender to palpate. Denies dysuria, urinary frequency/urgency and no foul odor urine.

## 2015-03-02 NOTE — ED Provider Notes (Signed)
CSN: [id number]     Arrival date & time 03/02/15  1126 History   First MD Initiated Contact with Patient 03/02/15 1142     Chief Complaint  Patient presents with  . Abdominal Pain     (Consider location/radiation/quality/duration/timing/severity/associated sxs/prior Treatment) HPI Complains of low intermittent abdominal pain onset 10 PM yesterday accompanied by 10 episodes of diarrhea, grossly bloody. She also had 10-15 episodes of vomiting. She does not think she saw blood in emesis. No treatment prior to coming here pain is presently mild. Associated symptoms include generalized weakness, she denies fever. No other associated symptoms nothing makes symptoms better or worse. Pain presently mild. No hematuria no urinary symptoms. Patient not nauseated presently Past Medical History  Diagnosis Date  . Cellulitis of right leg   . Glaucoma   . Cigarette smoker   . Hypertension   . Hyperlipidemia   . GERD (gastroesophageal reflux disease)   . Diverticulosis of colon   . IBS (irritable bowel syndrome)   . DJD (degenerative joint disease)   . Osteopenia   . Anxiety    Past Surgical History  Procedure Laterality Date  . Appendectomy    . Total abdominal hysterectomy    . Colonoscopy  07-02-10    per Dr. Henrene Pastor, severe diverticulosis, no polyps, repeat in 5 yrs    Family History  Problem Relation Age of Onset  . Colon cancer Father   . Hypertension Mother   . Hyperlipidemia Mother    History  Substance Use Topics  . Smoking status: Former Smoker -- 0.50 packs/day for 20 years    Types: Cigarettes    Quit date: 05/31/2013  . Smokeless tobacco: Never Used     Comment: still trying to quit  . Alcohol Use: 0.0 oz/week    0 Standard drinks or equivalent per week     Comment: social - wine   OB History    No data available     Review of Systems  HENT: Negative.   Respiratory: Negative.   Cardiovascular: Negative.   Gastrointestinal: Positive for nausea, vomiting, abdominal  pain and blood in stool.  Musculoskeletal: Negative.   Skin: Negative.   Neurological: Positive for weakness.  Psychiatric/Behavioral: Negative.   All other systems reviewed and are negative.     Allergies  Penicillins  Home Medications   Prior to Admission medications   Medication Sig Start Date End Date Taking? Authorizing Provider  aspirin 81 MG tablet Take 81 mg by mouth daily.    Historical Provider, MD  augmented betamethasone dipropionate (DIPROLENE-AF) 0.05 % cream Apply topically 2 (two) times daily. 10/10/14   Laurey Morale, MD  brimonidine (ALPHAGAN) 0.15 % ophthalmic solution Place 1 drop into both eyes 2 (two) times daily.    Historical Provider, MD  cetirizine (ZYRTEC) 10 MG tablet Take 10 mg by mouth daily.    Historical Provider, MD  Cholecalciferol (VITAMIN D) 2000 UNITS CAPS Take 1 capsule by mouth daily.      Historical Provider, MD  Cyanocobalamin (VITAMIN B-12 PO) Take by mouth daily.    Historical Provider, MD  dorzolamide-timolol (COSOPT) 22.3-6.8 MG/ML ophthalmic solution Place 1 drop into both eyes 2 (two) times daily. 11/14/13   Historical Provider, MD  ibuprofen (ADVIL,MOTRIN) 200 MG tablet Take 400 mg by mouth 2 (two) times daily as needed.     Historical Provider, MD  losartan-hydrochlorothiazide (HYZAAR) 100-25 MG per tablet Take 1 tablet by mouth daily. 05/09/14   Laurey Morale, MD  mometasone (  NASONEX) 50 MCG/ACT nasal spray Place 2 sprays into the nose daily. 05/25/12   Noralee Space, MD  Multiple Vitamin (MULTIVITAMIN) tablet Take 1 tablet by mouth daily.      Historical Provider, MD  Omega-3 Fatty Acids (FISH OIL) 1000 MG CAPS Take 1 capsule by mouth daily.    Historical Provider, MD  rosuvastatin (CRESTOR) 10 MG tablet Take 1 tablet (10 mg total) by mouth daily. 06/04/14   Laurey Morale, MD   BP 131/58 mmHg  Pulse 75  Temp(Src) 98.3 F (36.8 C) (Oral)  Resp 18  SpO2 100% Physical Exam  Constitutional: She appears well-developed and well-nourished.  No distress.  HENT:  Head: Normocephalic and atraumatic.  Mucous membranes dry  Eyes: Conjunctivae are normal. Pupils are equal, round, and reactive to light.  Neck: Neck supple. No tracheal deviation present. No thyromegaly present.  Cardiovascular: Normal rate and regular rhythm.   No murmur heard. Pulmonary/Chest: Effort normal and breath sounds normal.  Abdominal: Soft. Bowel sounds are normal. She exhibits no distension and no mass. There is tenderness. There is no rebound and no guarding.  Tender at left upper quadrant and left lower quadrant  Genitourinary:  Normal tone gross blood present  Musculoskeletal: Normal range of motion. She exhibits no edema or tenderness.  Neurological: She is alert. Coordination normal.  Skin: Skin is warm and dry. No rash noted.  Psychiatric: She has a normal mood and affect.  Nursing note and vitals reviewed.   ED Course  Procedures (including critical care time) Labs Review Labs Reviewed - No data to display  Imaging Review No results found.   EKG Interpretation None     Declines pain medicine presently  4:15 PM patient continues to content 7 appears comfortable. Intravenous Cipro Flagyl ordered. Results for orders placed or performed during the hospital encounter of 03/02/15  Comprehensive metabolic panel  Result Value Ref Range   Sodium 132 (L) 135 - 145 mmol/L   Potassium 3.3 (L) 3.5 - 5.1 mmol/L   Chloride 94 (L) 101 - 111 mmol/L   CO2 23 22 - 32 mmol/L   Glucose, Bld 146 (H) 65 - 99 mg/dL   BUN 28 (H) 6 - 20 mg/dL   Creatinine, Ser 0.89 0.44 - 1.00 mg/dL   Calcium 9.1 8.9 - 10.3 mg/dL   Total Protein 7.5 6.5 - 8.1 g/dL   Albumin 4.5 3.5 - 5.0 g/dL   AST 38 15 - 41 U/L   ALT 26 14 - 54 U/L   Alkaline Phosphatase 103 38 - 126 U/L   Total Bilirubin 0.9 0.3 - 1.2 mg/dL   GFR calc non Af Amer >60 >60 mL/min   GFR calc Af Amer >60 >60 mL/min   Anion gap 15 5 - 15  CBC with Differential/Platelet  Result Value Ref Range    WBC 18.7 (H) 4.0 - 10.5 K/uL   RBC 4.85 3.87 - 5.11 MIL/uL   Hemoglobin 14.8 12.0 - 15.0 g/dL   HCT 41.8 36.0 - 46.0 %   MCV 86.2 78.0 - 100.0 fL   MCH 30.5 26.0 - 34.0 pg   MCHC 35.4 30.0 - 36.0 g/dL   RDW 11.9 11.5 - 15.5 %   Platelets 305 150 - 400 K/uL   Neutrophils Relative % 93 (H) 43 - 77 %   Neutro Abs 17.4 (H) 1.7 - 7.7 K/uL   Lymphocytes Relative 5 (L) 12 - 46 %   Lymphs Abs 0.9 0.7 - 4.0 K/uL  Monocytes Relative 2 (L) 3 - 12 %   Monocytes Absolute 0.4 0.1 - 1.0 K/uL   Eosinophils Relative 0 0 - 5 %   Eosinophils Absolute 0.0 0.0 - 0.7 K/uL   Basophils Relative 0 0 - 1 %   Basophils Absolute 0.0 0.0 - 0.1 K/uL   Ct Abdomen Pelvis W Contrast  03/02/2015   CLINICAL DATA:  Nausea, vomiting, diarrhea with bloody stools and lower abdomen pain for 1 day.  EXAM: CT ABDOMEN AND PELVIS WITH CONTRAST  TECHNIQUE: Multidetector CT imaging of the abdomen and pelvis was performed using the standard protocol following bolus administration of intravenous contrast.  CONTRAST:  48mL OMNIPAQUE IOHEXOL 300 MG/ML SOLN, 178mL OMNIPAQUE IOHEXOL 300 MG/ML SOLN  COMPARISON:  None.  FINDINGS: Several liver cysts are identified. The liver is otherwise normal. The gallbladder is normal. The spleen, pancreas, adrenal glands and left kidney are normal. There are simple cysts within the right kidney.  Images of the bowel demonstrate diffuse bowel wall thickening involving the splenic flexure colon, descending colon, sigmoid colon with surrounding inflammation. There is no evidence of bowel necrosis. There is no small bowel obstruction. The appendix is not seen but no inflammation is noted around cecum.  There is atherosclerosis of the abdominal aorta. The origin of the celiac artery, superior mesenteric artery, inferior mesenteric artery are contrast opacified. There is no abdominal aortic aneurysm.  Fluid-filled bladder is normal. Minimal free fluid is identified in the pelvis. No acute abnormalities identified in  the visualized bones.  IMPRESSION: Diffuse bowel wall thickening involving the splenic flexure colon, descending colon, sigmoid colon with surrounding inflammation. Favor infectious inflammatory etiology. Although the area of abnormality is in the inferior mesenteric artery vascular territory, contrast opacified blood flow was identified in the margins of inferior mesenteric artery. There is no evidence of bowel necrosis.   Electronically Signed   By: Abelardo Diesel M.D.   On: 03/02/2015 16:09    MDM  I spoke with Dr. [place] Pink plan 23 hour observation MedSurg floor intravenous hydration. Nothing by mouth. Intravenous antibiotics Final diagnoses:  None  CT scan and examconsistent  with coliits Dx #1Colitis #2 rectal bleeding #3dehydration #4 hypokalenmia      Orlie Dakin, MD 03/03/15 1338

## 2015-03-03 DIAGNOSIS — Z720 Tobacco use: Secondary | ICD-10-CM | POA: Diagnosis not present

## 2015-03-03 DIAGNOSIS — Z66 Do not resuscitate: Secondary | ICD-10-CM | POA: Diagnosis present

## 2015-03-03 DIAGNOSIS — A09 Infectious gastroenteritis and colitis, unspecified: Secondary | ICD-10-CM | POA: Diagnosis present

## 2015-03-03 DIAGNOSIS — E876 Hypokalemia: Secondary | ICD-10-CM

## 2015-03-03 DIAGNOSIS — Z7982 Long term (current) use of aspirin: Secondary | ICD-10-CM | POA: Diagnosis not present

## 2015-03-03 DIAGNOSIS — Z8249 Family history of ischemic heart disease and other diseases of the circulatory system: Secondary | ICD-10-CM | POA: Diagnosis not present

## 2015-03-03 DIAGNOSIS — E871 Hypo-osmolality and hyponatremia: Secondary | ICD-10-CM | POA: Diagnosis present

## 2015-03-03 DIAGNOSIS — Z800 Family history of malignant neoplasm of digestive organs: Secondary | ICD-10-CM | POA: Diagnosis not present

## 2015-03-03 DIAGNOSIS — H409 Unspecified glaucoma: Secondary | ICD-10-CM | POA: Diagnosis present

## 2015-03-03 DIAGNOSIS — Z90710 Acquired absence of both cervix and uterus: Secondary | ICD-10-CM | POA: Diagnosis not present

## 2015-03-03 DIAGNOSIS — K529 Noninfective gastroenteritis and colitis, unspecified: Secondary | ICD-10-CM | POA: Diagnosis present

## 2015-03-03 DIAGNOSIS — E860 Dehydration: Secondary | ICD-10-CM | POA: Diagnosis present

## 2015-03-03 DIAGNOSIS — K219 Gastro-esophageal reflux disease without esophagitis: Secondary | ICD-10-CM | POA: Diagnosis present

## 2015-03-03 DIAGNOSIS — I10 Essential (primary) hypertension: Secondary | ICD-10-CM | POA: Diagnosis present

## 2015-03-03 DIAGNOSIS — Z79899 Other long term (current) drug therapy: Secondary | ICD-10-CM | POA: Diagnosis not present

## 2015-03-03 DIAGNOSIS — E785 Hyperlipidemia, unspecified: Secondary | ICD-10-CM | POA: Diagnosis present

## 2015-03-03 LAB — CBC
HCT: 32.8 % — ABNORMAL LOW (ref 36.0–46.0)
Hemoglobin: 11.7 g/dL — ABNORMAL LOW (ref 12.0–15.0)
MCH: 31.1 pg (ref 26.0–34.0)
MCHC: 35.7 g/dL (ref 30.0–36.0)
MCV: 87.2 fL (ref 78.0–100.0)
Platelets: 235 10*3/uL (ref 150–400)
RBC: 3.76 MIL/uL — AB (ref 3.87–5.11)
RDW: 12.3 % (ref 11.5–15.5)
WBC: 16 10*3/uL — ABNORMAL HIGH (ref 4.0–10.5)

## 2015-03-03 LAB — COMPREHENSIVE METABOLIC PANEL
ALBUMIN: 3.1 g/dL — AB (ref 3.5–5.0)
ALT: 19 U/L (ref 14–54)
AST: 23 U/L (ref 15–41)
Alkaline Phosphatase: 74 U/L (ref 38–126)
Anion gap: 7 (ref 5–15)
BUN: 13 mg/dL (ref 6–20)
CO2: 23 mmol/L (ref 22–32)
Calcium: 7.6 mg/dL — ABNORMAL LOW (ref 8.9–10.3)
Chloride: 103 mmol/L (ref 101–111)
Creatinine, Ser: 0.6 mg/dL (ref 0.44–1.00)
GFR calc Af Amer: >60 mL/min (ref >60)
Glucose, Bld: 127 mg/dL — ABNORMAL HIGH (ref 65–99)
Potassium: 3 mmol/L — ABNORMAL LOW (ref 3.5–5.1)
Sodium: 133 mmol/L — ABNORMAL LOW (ref 135–145)
Total Bilirubin: 0.8 mg/dL (ref 0.3–1.2)
Total Protein: 5.6 g/dL — ABNORMAL LOW (ref 6.5–8.1)

## 2015-03-03 MED ORDER — POTASSIUM CHLORIDE CRYS ER 20 MEQ PO TBCR
40.0000 meq | EXTENDED_RELEASE_TABLET | Freq: Two times a day (BID) | ORAL | Status: AC
Start: 2015-03-03 — End: 2015-03-03
  Administered 2015-03-03 (×2): 40 meq via ORAL
  Filled 2015-03-03 (×2): qty 2

## 2015-03-03 NOTE — Progress Notes (Signed)
Notified physician that lab contacted me with Hgb results for patient.  Hgb today 11.7 and yesterday 14.8.  No orders received from physician.

## 2015-03-03 NOTE — Progress Notes (Signed)
PROGRESS NOTE  Alicia Dickson WVP:[id number] DOB: 23-Jan-1941 DOA: 03/02/2015 PCP: Laurey Morale, MD  HPI/Recap of past 32 hours: 74 year old female with past medical history of tobacco abuse and hypertension admitted 7/241 day of nausea/vomiting and diarrhea with findings of colitis on CT felt to be infectious. Patient started on IV Cipro and Flagyl and brought into hospitalist service.  This morning, patient feeling much better. Tolerated clear liquids. No fever. White count decreased down to 16.  Assessment/Plan: Principal Problem:   Colitis, infectious: Continue IV antibiotics until white count closer to normal. Advance diet to full liquids and if tolerating, solid food Active Problems:   Hyperlipidemia   CIGARETTE SMOKER: Patient declines nicotine patch   Glaucoma: Continue eyedrops   Essential hypertension: Holding antihypertensives    Hypokalemia: Severe dehydration, replacing   Hyponatremia: Secondary dehydration, treating with IV fluids   Code Status: DO NOT RESUSCITATE  Family Communication: Daughter and son at the bedside  Disposition Plan: Home likely in next 1-2 days once white count normalized and patient tolerating solid food   Consultants:  None  Procedures:  None  Antibiotics:  IV Cipro 7/2-present  IV Flagyl 7/2-present   Objective: BP 124/58 mmHg  Pulse 82  Temp(Src) 98.1 F (36.7 C) (Oral)  Resp 18  SpO2 99% No intake or output data in the 24 hours ending 03/03/15 1256 There were no vitals filed for this visit.  Exam:   General:  Alert and oriented 3, no acute distress  Cardiovascular: Regular rate and rhythm, S1 and S2  Respiratory: Clear to auscultation bilaterally  Abdomen: Soft, nondistended, minimal tenderness on left side, positive bowel sounds  Musculoskeletal: No clubbing cyanosis or edema   Data Reviewed: Basic Metabolic Panel:  Recent Labs Lab 03/02/15 1235 03/03/15 0521  NA 132* 133*  K 3.3* 3.0*  CL 94* 103    CO2 23 23  GLUCOSE 146* 127*  BUN 28* 13  CREATININE 0.89 0.60  CALCIUM 9.1 7.6*   Liver Function Tests:  Recent Labs Lab 03/02/15 1235 03/03/15 0521  AST 38 23  ALT 26 19  ALKPHOS 103 74  BILITOT 0.9 0.8  PROT 7.5 5.6*  ALBUMIN 4.5 3.1*   No results for input(s): LIPASE, AMYLASE in the last 168 hours. No results for input(s): AMMONIA in the last 168 hours. CBC:  Recent Labs Lab 03/02/15 1235 03/03/15 0920  WBC 18.7* 16.0*  NEUTROABS 17.4*  --   HGB 14.8 11.7*  HCT 41.8 32.8*  MCV 86.2 87.2  PLT 305 235   Cardiac Enzymes:   No results for input(s): CKTOTAL, CKMB, CKMBINDEX, TROPONINI in the last 168 hours. BNP (last 3 results) No results for input(s): BNP in the last 8760 hours.  ProBNP (last 3 results) No results for input(s): PROBNP in the last 8760 hours.  CBG: No results for input(s): GLUCAP in the last 168 hours.  No results found for this or any previous visit (from the past 240 hour(s)).   Studies: Ct Abdomen Pelvis W Contrast  03/02/2015   CLINICAL DATA:  Nausea, vomiting, diarrhea with bloody stools and lower abdomen pain for 1 day.  EXAM: CT ABDOMEN AND PELVIS WITH CONTRAST  TECHNIQUE: Multidetector CT imaging of the abdomen and pelvis was performed using the standard protocol following bolus administration of intravenous contrast.  CONTRAST:  63mL OMNIPAQUE IOHEXOL 300 MG/ML SOLN, 118mL OMNIPAQUE IOHEXOL 300 MG/ML SOLN  COMPARISON:  None.  FINDINGS: Several liver cysts are identified. The liver is otherwise normal. The gallbladder is  normal. The spleen, pancreas, adrenal glands and left kidney are normal. There are simple cysts within the right kidney.  Images of the bowel demonstrate diffuse bowel wall thickening involving the splenic flexure colon, descending colon, sigmoid colon with surrounding inflammation. There is no evidence of bowel necrosis. There is no small bowel obstruction. The appendix is not seen but no inflammation is noted around cecum.   There is atherosclerosis of the abdominal aorta. The origin of the celiac artery, superior mesenteric artery, inferior mesenteric artery are contrast opacified. There is no abdominal aortic aneurysm.  Fluid-filled bladder is normal. Minimal free fluid is identified in the pelvis. No acute abnormalities identified in the visualized bones.  IMPRESSION: Diffuse bowel wall thickening involving the splenic flexure colon, descending colon, sigmoid colon with surrounding inflammation. Favor infectious inflammatory etiology. Although the area of abnormality is in the inferior mesenteric artery vascular territory, contrast opacified blood flow was identified in the margins of inferior mesenteric artery. There is no evidence of bowel necrosis.   Electronically Signed   By: Abelardo Diesel M.D.   On: 03/02/2015 16:09    Scheduled Meds: . brimonidine  1 drop Both Eyes BID  . ciprofloxacin  400 mg Intravenous Q12H  . dorzolamide-timolol  1 drop Both Eyes BID  . metronidazole  500 mg Intravenous Q8H  . potassium chloride  40 mEq Oral BID    Continuous Infusions: . sodium chloride 75 mL/hr at 03/03/15 3825     Time spent: 15 minutes  [facility] Pager [phone]. If 7PM-7AM, please contact night-coverage at [email or web address], password [email or web address] 03/03/2015, 12:56 PM

## 2015-03-04 LAB — CBC
HCT: 34.6 % — ABNORMAL LOW (ref 36.0–46.0)
HEMOGLOBIN: 12.2 g/dL (ref 12.0–15.0)
MCH: 30.9 pg (ref 26.0–34.0)
MCHC: 35.3 g/dL (ref 30.0–36.0)
MCV: 87.6 fL (ref 78.0–100.0)
PLATELETS: 249 10*3/uL (ref 150–400)
RBC: 3.95 MIL/uL (ref 3.87–5.11)
RDW: 12.3 % (ref 11.5–15.5)
WBC: 15.9 10*3/uL — ABNORMAL HIGH (ref 4.0–10.5)

## 2015-03-04 LAB — BASIC METABOLIC PANEL
Anion gap: 7 (ref 5–15)
BUN: 6 mg/dL (ref 6–20)
CHLORIDE: 102 mmol/L (ref 101–111)
CO2: 21 mmol/L — ABNORMAL LOW (ref 22–32)
Calcium: 7.9 mg/dL — ABNORMAL LOW (ref 8.9–10.3)
Creatinine, Ser: 0.52 mg/dL (ref 0.44–1.00)
Glucose, Bld: 126 mg/dL — ABNORMAL HIGH (ref 65–99)
Potassium: 3.9 mmol/L (ref 3.5–5.1)
Sodium: 130 mmol/L — ABNORMAL LOW (ref 135–145)

## 2015-03-04 MED ORDER — CIPROFLOXACIN HCL 500 MG PO TABS: 500.0000 mg | ORAL_TABLET | Freq: Two times a day (BID) | ORAL | Status: DC

## 2015-03-04 MED ORDER — METRONIDAZOLE 500 MG PO TABS: 500.0000 mg | ORAL_TABLET | Freq: Three times a day (TID) | ORAL | Status: DC

## 2015-03-04 MED ORDER — METRONIDAZOLE 500 MG PO TABS
500.0000 mg | ORAL_TABLET | Freq: Three times a day (TID) | ORAL | Status: DC
  Administered 2015-03-04: 500 mg via ORAL
  Filled 2015-03-04 (×3): qty 1

## 2015-03-04 MED ORDER — CIPROFLOXACIN HCL 500 MG PO TABS
500.0000 mg | ORAL_TABLET | Freq: Two times a day (BID) | ORAL | Status: DC
  Filled 2015-03-04 (×2): qty 1

## 2015-03-04 NOTE — Discharge Summary (Signed)
Discharge Summary  Alicia Dickson [name]:[id number] DOB: [birth year]  PCP: Laurey Morale, MD  Admit date: 03/02/2015 Discharge date: 03/04/2015  Time spent: 25 minutes  Recommendations for Outpatient Follow-up:  1. New medication: Cipro 500 mg by mouth twice a day 5 days 2. New Medication: Flagyl 500 mg every 8 hours 5 days 3. Patient will follow up with her PCP in the next 2 weeks. At that time repeat CBC recommended   Discharge Diagnoses:  Active Hospital Problems   Diagnosis Date Noted  . Colitis, infectious 03/02/2015  . Colitis 03/02/2015  . Hypokalemia 03/02/2015  . Hyponatremia 03/02/2015  . Glaucoma 03/29/2009  . CIGARETTE SMOKER 02/22/2008  . Essential hypertension 01/07/2008  . Hyperlipidemia 01/07/2008    Resolved Hospital Problems   Diagnosis Date Noted Date Resolved  No resolved problems to display.    Discharge Condition: Improved, being discharged home  Diet recommendation: Low-sodium  Filed Weights   03/03/15 1600  Weight: 46.267 kg (102 lb)    History of present illness:  74 year old female with past medical history of tobacco abuse and hypertension admitted 7/2 for 1 day of nausea/vomiting and diarrhea with findings of colitis on CT felt to be infectious. Patient started on IV Cipro and Flagyl and brought into hospitalist service.  Hospital Course:  Principal Problem:   Colitis, infectious: Hospital day 2, patient feeling much better. White blood cell count only minimally better down from 18 to 16.  Diet able to be advanced. No further fevers. By 7/4, patient able tolerate solid food with no diarrhea, no hematochezia, no abdominal pain or nausea/vomiting. She clinically looks good, although white blood cell count only minimally changed and 15.8. After discussion with patient she felt comfortable going home on by mouth antibiotics. He is possible that when she starts by mouth antibiotics with direct route, her white count should normalize quickly. Active  Problems:   Hyperlipidemia   CIGARETTE SMOKER: Counseled patient to quit.   Glaucoma: Continued on her eyedrops   Essential hypertension: Home medications resumed on discharge    Hypokalemia: Sick and her dehydration, replaced   Hyponatremia: Secondary dehydration, resolved with IV fluids   Procedures:  None  Consultations:  None  Discharge Exam: BP 146/79 mmHg  Pulse 80  Temp(Src) 98.1 F (36.7 C) (Oral)  Resp 18  Ht 5' (1.524 m)  Wt 46.267 kg (102 lb)  BMI 19.92 kg/m2  SpO2 95%  General: Alert and oriented 3, no acute distress Cardiovascular: Regular rate and rhythm, S1-S2 Respiratory: Clear to auscultation bilaterally Abdomen: Soft, nontender today, nondistended, normoactive bowel sounds  Discharge Instructions You were cared for by a hospitalist during your hospital stay. If you have any questions about your discharge medications or the care you received while you were in the hospital after you are discharged, you can call the unit and asked to speak with the hospitalist on call if the hospitalist that took care of you is not available. Once you are discharged, your primary care physician will handle any further medical issues. Please note that NO REFILLS for any discharge medications will be authorized once you are discharged, as it is imperative that you return to your primary care physician (or establish a relationship with a primary care physician if you do not have one) for your aftercare needs so that they can reassess your need for medications and monitor your lab values.  Discharge Instructions    Diet - low sodium heart healthy    Complete by:  As directed  Increase activity slowly    Complete by:  As directed             Medication List    TAKE these medications        aspirin 81 MG tablet  Take 81 mg by mouth daily.     augmented betamethasone dipropionate 0.05 % cream  Commonly known as:  DIPROLENE-AF  Apply topically 2 (two) times daily.      brimonidine 0.15 % ophthalmic solution  Commonly known as:  ALPHAGAN  Place 1 drop into both eyes 2 (two) times daily.     cetirizine 10 MG tablet  Commonly known as:  ZYRTEC  Take 10 mg by mouth daily.     ciprofloxacin 500 MG tablet  Commonly known as:  CIPRO  Take 1 tablet (500 mg total) by mouth 2 (two) times daily.     dorzolamide-timolol 22.3-6.8 MG/ML ophthalmic solution  Commonly known as:  COSOPT  Place 1 drop into both eyes 2 (two) times daily.     ibuprofen 200 MG tablet  Commonly known as:  ADVIL,MOTRIN  Take 400 mg by mouth 2 (two) times daily as needed for mild pain.     losartan-hydrochlorothiazide 100-25 MG per tablet  Commonly known as:  HYZAAR  Take 1 tablet by mouth daily.     metroNIDAZOLE 500 MG tablet  Commonly known as:  FLAGYL  Take 1 tablet (500 mg total) by mouth every 8 (eight) hours.     multivitamin tablet  Take 1 tablet by mouth daily.     rosuvastatin 10 MG tablet  Commonly known as:  CRESTOR  Take 1 tablet (10 mg total) by mouth daily.     VITAMIN B-12 PO  Take by mouth daily.     Vitamin D 2000 UNITS Caps  Take 1 capsule by mouth daily.       Allergies  Allergen Reactions  . Penicillins     REACTION: rash       Follow-up Information    Follow up with FRY,STEPHEN A, MD In 2 weeks.   Specialty:  Family Medicine   Contact information:   [place] [address] [phone]        The results of significant diagnostics from this hospitalization (including imaging, microbiology, ancillary and laboratory) are listed below for reference.    Significant Diagnostic Studies: Ct Abdomen Pelvis W Contrast  03/02/2015   CLINICAL DATA:  Nausea, vomiting, diarrhea with bloody stools and lower abdomen pain for 1 day.  EXAM: CT ABDOMEN AND PELVIS WITH CONTRAST  TECHNIQUE: Multidetector CT imaging of the abdomen and pelvis was performed using the standard protocol following bolus administration of intravenous  contrast.  CONTRAST:  51mL OMNIPAQUE IOHEXOL 300 MG/ML SOLN, 122mL OMNIPAQUE IOHEXOL 300 MG/ML SOLN  COMPARISON:  None.  FINDINGS: Several liver cysts are identified. The liver is otherwise normal. The gallbladder is normal. The spleen, pancreas, adrenal glands and left kidney are normal. There are simple cysts within the right kidney.  Images of the bowel demonstrate diffuse bowel wall thickening involving the splenic flexure colon, descending colon, sigmoid colon with surrounding inflammation. There is no evidence of bowel necrosis. There is no small bowel obstruction. The appendix is not seen but no inflammation is noted around cecum.  There is atherosclerosis of the abdominal aorta. The origin of the celiac artery, superior mesenteric artery, inferior mesenteric artery are contrast opacified. There is no abdominal aortic aneurysm.  Fluid-filled bladder is normal. Minimal free fluid  is identified in the pelvis. No acute abnormalities identified in the visualized bones.  IMPRESSION: Diffuse bowel wall thickening involving the splenic flexure colon, descending colon, sigmoid colon with surrounding inflammation. Favor infectious inflammatory etiology. Although the area of abnormality is in the inferior mesenteric artery vascular territory, contrast opacified blood flow was identified in the margins of inferior mesenteric artery. There is no evidence of bowel necrosis.   Electronically Signed   By: Abelardo Diesel M.D.   On: 03/02/2015 16:09    Microbiology: No results found for this or any previous visit (from the past 240 hour(s)).   Labs: Basic Metabolic Panel:  Recent Labs Lab 03/02/15 1235 03/03/15 0521 03/04/15 0457  NA 132* 133* 130*  K 3.3* 3.0* 3.9  CL 94* 103 102  CO2 23 23 21*  GLUCOSE 146* 127* 126*  BUN 28* 13 6  CREATININE 0.89 0.60 0.52  CALCIUM 9.1 7.6* 7.9*   Liver Function Tests:  Recent Labs Lab 03/02/15 1235 03/03/15 0521  AST 38 23  ALT 26 19  ALKPHOS 103 74  BILITOT  0.9 0.8  PROT 7.5 5.6*  ALBUMIN 4.5 3.1*   No results for input(s): LIPASE, AMYLASE in the last 168 hours. No results for input(s): AMMONIA in the last 168 hours. CBC:  Recent Labs Lab 03/02/15 1235 03/03/15 0920 03/04/15 0457  WBC 18.7* 16.0* 15.9*  NEUTROABS 17.4*  --   --   HGB 14.8 11.7* 12.2  HCT 41.8 32.8* 34.6*  MCV 86.2 87.2 87.6  PLT 305 235 249   Cardiac Enzymes: No results for input(s): CKTOTAL, CKMB, CKMBINDEX, TROPONINI in the last 168 hours. BNP: BNP (last 3 results) No results for input(s): BNP in the last 8760 hours.  ProBNP (last 3 results) No results for input(s): PROBNP in the last 8760 hours.  CBG: No results for input(s): GLUCAP in the last 168 hours.     Signed:  Annita Brod  [facility] 03/04/2015, 1:44 PM

## 2015-03-04 NOTE — Discharge Instructions (Signed)

## 2015-03-04 NOTE — Plan of Care (Signed)
Problem: Phase I Progression Outcomes Goal: Other Phase I Outcomes/Goals Outcome: Progressing Patient reports no stools on 7/3

## 2015-03-18 ENCOUNTER — Encounter: Payer: Self-pay | Admitting: Family Medicine

## 2015-03-18 ENCOUNTER — Ambulatory Visit (INDEPENDENT_AMBULATORY_CARE_PROVIDER_SITE_OTHER): Payer: Medicare HMO | Admitting: Family Medicine

## 2015-03-18 VITALS — BP 144/88 | HR 84 | Temp 98.1°F | Ht 60.0 in | Wt 104.0 lb

## 2015-03-18 DIAGNOSIS — A09 Infectious gastroenteritis and colitis, unspecified: Secondary | ICD-10-CM | POA: Diagnosis not present

## 2015-03-18 LAB — CBC WITH DIFFERENTIAL/PLATELET
Basophils Absolute: 0 10*3/uL (ref 0.0–0.1)
Basophils Relative: 0.4 % (ref 0.0–3.0)
EOS ABS: 0.2 10*3/uL (ref 0.0–0.7)
EOS PCT: 2.6 % (ref 0.0–5.0)
HCT: 38.4 % (ref 36.0–46.0)
HEMOGLOBIN: 12.9 g/dL (ref 12.0–15.0)
LYMPHS ABS: 1.9 10*3/uL (ref 0.7–4.0)
LYMPHS PCT: 24.2 % (ref 12.0–46.0)
MCHC: 33.7 g/dL (ref 30.0–36.0)
MCV: 91.1 fl (ref 78.0–100.0)
MONO ABS: 0.5 10*3/uL (ref 0.1–1.0)
Monocytes Relative: 6.3 % (ref 3.0–12.0)
NEUTROS PCT: 66.5 % (ref 43.0–77.0)
Neutro Abs: 5.2 10*3/uL (ref 1.4–7.7)
PLATELETS: 469 10*3/uL — AB (ref 150.0–400.0)
RBC: 4.22 Mil/uL (ref 3.87–5.11)
RDW: 12.7 % (ref 11.5–15.5)
WBC: 7.8 10*3/uL (ref 4.0–10.5)

## 2015-03-18 NOTE — Progress Notes (Signed)
Pre visit review using our clinic review tool, if applicable. No additional management support is needed unless otherwise documented below in the visit note. 

## 2015-03-18 NOTE — Progress Notes (Signed)
   Subjective:    Patient ID: Alicia Dickson, female    DOB: November 25, 1940, 74 y.o.   MRN: [id number]  HPI Here to follow up a hospital stay from 03-02-15 to 03-04-15 for an infectious colitis. It was unclear as to the origin of this infection. She had not been traveling. She presented with abdominal cramps, vomiting, and bloody diarrhea. She was treated with Cipro and Flagyl, and she recovered nicely. She has no more cramps or diarrhea, but she is still a little weak, her appetite is slowly returning. No fever. No one else in the family has been sick.    Review of Systems  Constitutional: Negative.   Respiratory: Negative.   Cardiovascular: Negative.   Gastrointestinal: Negative.   Endocrine: Negative.   Genitourinary: Negative.        Objective:   Physical Exam  Constitutional: She is oriented to person, place, and time. She appears well-developed and well-nourished. No distress.  Neck: No thyromegaly present.  Cardiovascular: Normal rate, regular rhythm, normal heart sounds and intact distal pulses.   Pulmonary/Chest: Effort normal and breath sounds normal.  Abdominal: Soft. Bowel sounds are normal. She exhibits no distension and no mass. There is no tenderness.  Lymphadenopathy:    She has no cervical adenopathy.  Neurological: She is alert and oriented to person, place, and time.          Assessment & Plan:  She is recovering from an infectious colitis. As her appetite picks back up her strength should return to normal. Recheck a CBC.

## 2015-03-22 ENCOUNTER — Telehealth: Payer: Self-pay | Admitting: Family Medicine

## 2015-03-22 NOTE — Telephone Encounter (Signed)
Pt would like the results of her lab work from last week.

## 2015-03-22 NOTE — Telephone Encounter (Signed)
I spoke with pt and went over results. 

## 2015-05-13 ENCOUNTER — Other Ambulatory Visit: Payer: Self-pay | Admitting: Family Medicine

## 2015-05-14 ENCOUNTER — Other Ambulatory Visit: Payer: Self-pay | Admitting: Family Medicine

## 2015-05-16 ENCOUNTER — Telehealth: Payer: Self-pay | Admitting: Family Medicine

## 2015-05-16 ENCOUNTER — Other Ambulatory Visit: Payer: Self-pay | Admitting: Family Medicine

## 2015-05-16 NOTE — Telephone Encounter (Signed)
Pt request refill of the following: rosuvastatin (CRESTOR) 10 MG tablet  Pt said pharmacy said this was decline    Phamacy: [facility]

## 2015-05-17 NOTE — Telephone Encounter (Signed)
I called pharmacy and they will get script ready, I left a voice message for pt with this information.

## 2015-05-17 NOTE — Telephone Encounter (Signed)
Script was sent in on 05/14/15.

## 2015-06-06 ENCOUNTER — Other Ambulatory Visit (INDEPENDENT_AMBULATORY_CARE_PROVIDER_SITE_OTHER): Payer: Medicare HMO

## 2015-06-06 DIAGNOSIS — Z0000 Encounter for general adult medical examination without abnormal findings: Secondary | ICD-10-CM

## 2015-06-06 DIAGNOSIS — I10 Essential (primary) hypertension: Secondary | ICD-10-CM

## 2015-06-06 DIAGNOSIS — E785 Hyperlipidemia, unspecified: Secondary | ICD-10-CM | POA: Diagnosis not present

## 2015-06-06 LAB — BASIC METABOLIC PANEL
BUN: 19 mg/dL (ref 6–23)
CO2: 28 meq/L (ref 19–32)
CREATININE: 0.68 mg/dL (ref 0.40–1.20)
Calcium: 9.7 mg/dL (ref 8.4–10.5)
Chloride: 96 mEq/L (ref 96–112)
GFR: 89.48 mL/min (ref >60.00)
GLUCOSE: 93 mg/dL (ref 70–99)
Potassium: 4.4 mEq/L (ref 3.5–5.1)
Sodium: 132 mEq/L — ABNORMAL LOW (ref 135–145)

## 2015-06-06 LAB — POCT URINALYSIS DIPSTICK
BILIRUBIN UA: NEGATIVE
GLUCOSE UA: NEGATIVE
Ketones, UA: NEGATIVE
Leukocytes, UA: NEGATIVE
Nitrite, UA: NEGATIVE
Protein, UA: NEGATIVE
SPEC GRAV UA: 1.015
UROBILINOGEN UA: 0.2
pH, UA: 6.5

## 2015-06-06 LAB — CBC WITH DIFFERENTIAL/PLATELET
BASOS PCT: 0.4 % (ref 0.0–3.0)
Basophils Absolute: 0 10*3/uL (ref 0.0–0.1)
EOS ABS: 0.4 10*3/uL (ref 0.0–0.7)
Eosinophils Relative: 4.7 % (ref 0.0–5.0)
HCT: 41.1 % (ref 36.0–46.0)
HEMOGLOBIN: 13.9 g/dL (ref 12.0–15.0)
LYMPHS ABS: 2.6 10*3/uL (ref 0.7–4.0)
Lymphocytes Relative: 31 % (ref 12.0–46.0)
MCHC: 33.9 g/dL (ref 30.0–36.0)
MCV: 91.3 fl (ref 78.0–100.0)
MONO ABS: 0.6 10*3/uL (ref 0.1–1.0)
Monocytes Relative: 7.5 % (ref 3.0–12.0)
NEUTROS ABS: 4.7 10*3/uL (ref 1.4–7.7)
Neutrophils Relative %: 56.4 % (ref 43.0–77.0)
PLATELETS: 342 10*3/uL (ref 150.0–400.0)
RBC: 4.5 Mil/uL (ref 3.87–5.11)
RDW: 12.4 % (ref 11.5–15.5)
WBC: 8.3 10*3/uL (ref 4.0–10.5)

## 2015-06-06 LAB — HEPATIC FUNCTION PANEL
ALT: 19 U/L (ref 0–35)
AST: 27 U/L (ref 0–37)
Albumin: 4.4 g/dL (ref 3.5–5.2)
Alkaline Phosphatase: 77 U/L (ref 39–117)
BILIRUBIN TOTAL: 0.4 mg/dL (ref 0.2–1.2)
Bilirubin, Direct: 0.1 mg/dL (ref 0.0–0.3)
Total Protein: 7 g/dL (ref 6.0–8.3)

## 2015-06-06 LAB — LIPID PANEL
CHOLESTEROL: 157 mg/dL (ref 0–200)
HDL: 65.9 mg/dL (ref >39.00)
LDL CALC: 75 mg/dL (ref 0–99)
NonHDL: 91.07
TRIGLYCERIDES: 81 mg/dL (ref 0.0–149.0)
Total CHOL/HDL Ratio: 2
VLDL: 16.2 mg/dL (ref 0.0–40.0)

## 2015-06-06 LAB — TSH: TSH: 2.46 u[IU]/mL (ref 0.35–4.50)

## 2015-06-10 ENCOUNTER — Encounter: Payer: Self-pay | Admitting: Family Medicine

## 2015-06-10 ENCOUNTER — Ambulatory Visit (INDEPENDENT_AMBULATORY_CARE_PROVIDER_SITE_OTHER): Payer: Medicare HMO | Admitting: Family Medicine

## 2015-06-10 VITALS — BP 148/93 | HR 66 | Temp 97.8°F | Ht 60.0 in | Wt 103.0 lb

## 2015-06-10 DIAGNOSIS — Z0000 Encounter for general adult medical examination without abnormal findings: Secondary | ICD-10-CM | POA: Diagnosis not present

## 2015-06-10 DIAGNOSIS — Z23 Encounter for immunization: Secondary | ICD-10-CM | POA: Diagnosis not present

## 2015-06-10 DIAGNOSIS — I10 Essential (primary) hypertension: Secondary | ICD-10-CM | POA: Diagnosis not present

## 2015-06-10 DIAGNOSIS — Z720 Tobacco use: Secondary | ICD-10-CM | POA: Diagnosis not present

## 2015-06-10 MED ORDER — ROSUVASTATIN CALCIUM 10 MG PO TABS: 10.0000 mg | ORAL_TABLET | Freq: Every day | ORAL | Status: DC

## 2015-06-10 MED ORDER — LOSARTAN POTASSIUM-HCTZ 100-25 MG PO TABS: 1.0000 | ORAL_TABLET | Freq: Every day | ORAL | Status: DC

## 2015-06-10 MED ORDER — VARENICLINE TARTRATE 1 MG PO TABS: 1.0000 mg | ORAL_TABLET | Freq: Two times a day (BID) | ORAL | Status: DC

## 2015-06-10 MED ORDER — VARENICLINE TARTRATE 0.5 MG X 11 & 1 MG X 42 PO MISC: ORAL | Status: DC

## 2015-06-10 NOTE — Progress Notes (Signed)
Pre visit review using our clinic review tool, if applicable. No additional management support is needed unless otherwise documented below in the visit note. 

## 2015-06-10 NOTE — Progress Notes (Signed)
   Subjective:    Patient ID: Alicia Dickson, female    DOB: April 30, 1941, 74 y.o.   MRN: [id number]  HPI 74 yr old female for a cpx. She feels well and has no complaints. She does ask for a rx for Chantix to quit smoking. OTC patches have not helped much. Her BP at home is stable.    Review of Systems  Constitutional: Negative.   HENT: Negative.   Eyes: Negative.   Respiratory: Negative.   Cardiovascular: Negative.   Gastrointestinal: Negative.   Genitourinary: Negative for dysuria, urgency, frequency, hematuria, flank pain, decreased urine volume, enuresis, difficulty urinating, pelvic pain and dyspareunia.  Musculoskeletal: Negative.   Skin: Negative.   Neurological: Negative.   Psychiatric/Behavioral: Negative.        Objective:   Physical Exam  Constitutional: She is oriented to person, place, and time. She appears well-developed and well-nourished. No distress.  HENT:  Head: Normocephalic and atraumatic.  Right Ear: External ear normal.  Left Ear: External ear normal.  Nose: Nose normal.  Mouth/Throat: Oropharynx is clear and moist. No oropharyngeal exudate.  Eyes: Conjunctivae and EOM are normal. Pupils are equal, round, and reactive to light. No scleral icterus.  Neck: Normal range of motion. Neck supple. No JVD present. No thyromegaly present.  Cardiovascular: Normal rate, regular rhythm, normal heart sounds and intact distal pulses.  Exam reveals no gallop and no friction rub.   No murmur heard. EKG normal   Pulmonary/Chest: Effort normal and breath sounds normal. No respiratory distress. She has no wheezes. She has no rales. She exhibits no tenderness.  Abdominal: Soft. Bowel sounds are normal. She exhibits no distension and no mass. There is no tenderness. There is no rebound and no guarding.  Musculoskeletal: Normal range of motion. She exhibits no edema or tenderness.  Lymphadenopathy:    She has no cervical adenopathy.  Neurological: She is alert and oriented to  person, place, and time. She has normal reflexes. No cranial nerve deficit. She exhibits normal muscle tone. Coordination normal.  Skin: Skin is warm and dry. No rash noted. No erythema.  Psychiatric: She has a normal mood and affect. Her behavior is normal. Judgment and thought content normal.          Assessment & Plan:  Well exam. She will try Chantix for 3 months. Set up a colonoscopy.

## 2015-06-13 ENCOUNTER — Encounter: Payer: Self-pay | Admitting: Obstetrics & Gynecology

## 2015-06-13 DIAGNOSIS — Z01419 Encounter for gynecological examination (general) (routine) without abnormal findings: Secondary | ICD-10-CM | POA: Diagnosis not present

## 2015-06-13 DIAGNOSIS — Z1231 Encounter for screening mammogram for malignant neoplasm of breast: Secondary | ICD-10-CM | POA: Diagnosis not present

## 2015-08-14 ENCOUNTER — Encounter: Payer: Self-pay | Admitting: Internal Medicine

## 2015-09-12 DIAGNOSIS — H401112 Primary open-angle glaucoma, right eye, moderate stage: Secondary | ICD-10-CM | POA: Diagnosis not present

## 2015-09-18 DIAGNOSIS — C44529 Squamous cell carcinoma of skin of other part of trunk: Secondary | ICD-10-CM | POA: Diagnosis not present

## 2015-09-18 DIAGNOSIS — L309 Dermatitis, unspecified: Secondary | ICD-10-CM | POA: Diagnosis not present

## 2015-09-18 DIAGNOSIS — C44722 Squamous cell carcinoma of skin of right lower limb, including hip: Secondary | ICD-10-CM | POA: Diagnosis not present

## 2015-11-14 DIAGNOSIS — Z5181 Encounter for therapeutic drug level monitoring: Secondary | ICD-10-CM | POA: Diagnosis not present

## 2015-11-14 DIAGNOSIS — L308 Other specified dermatitis: Secondary | ICD-10-CM | POA: Diagnosis not present

## 2015-11-14 DIAGNOSIS — L821 Other seborrheic keratosis: Secondary | ICD-10-CM | POA: Diagnosis not present

## 2015-11-14 DIAGNOSIS — D485 Neoplasm of uncertain behavior of skin: Secondary | ICD-10-CM | POA: Diagnosis not present

## 2015-11-14 DIAGNOSIS — L309 Dermatitis, unspecified: Secondary | ICD-10-CM | POA: Diagnosis not present

## 2015-12-01 ENCOUNTER — Other Ambulatory Visit: Payer: Self-pay | Admitting: Family Medicine

## 2015-12-09 DIAGNOSIS — B86 Scabies: Secondary | ICD-10-CM | POA: Diagnosis not present

## 2015-12-09 DIAGNOSIS — Z79899 Other long term (current) drug therapy: Secondary | ICD-10-CM | POA: Diagnosis not present

## 2015-12-09 DIAGNOSIS — L309 Dermatitis, unspecified: Secondary | ICD-10-CM | POA: Diagnosis not present

## 2015-12-09 DIAGNOSIS — L209 Atopic dermatitis, unspecified: Secondary | ICD-10-CM | POA: Diagnosis not present

## 2015-12-12 DIAGNOSIS — H401112 Primary open-angle glaucoma, right eye, moderate stage: Secondary | ICD-10-CM | POA: Diagnosis not present

## 2015-12-19 ENCOUNTER — Encounter: Payer: Self-pay | Admitting: Family Medicine

## 2015-12-19 ENCOUNTER — Ambulatory Visit (INDEPENDENT_AMBULATORY_CARE_PROVIDER_SITE_OTHER): Payer: Medicare HMO | Admitting: Family Medicine

## 2015-12-19 VITALS — BP 146/77 | HR 104 | Temp 98.2°F | Ht 60.0 in | Wt 99.0 lb

## 2015-12-19 DIAGNOSIS — L209 Atopic dermatitis, unspecified: Secondary | ICD-10-CM

## 2015-12-19 MED ORDER — PREDNISONE 10 MG PO TABS: ORAL_TABLET | ORAL | Status: DC

## 2015-12-19 MED ORDER — METHYLPREDNISOLONE ACETATE 80 MG/ML IJ SUSP
120.0000 mg | Freq: Once | INTRAMUSCULAR | Status: AC
  Administered 2015-12-19: 120 mg via INTRAMUSCULAR

## 2015-12-19 MED ORDER — BETAMETHASONE DIPROPIONATE AUG 0.05 % EX CREA: TOPICAL_CREAM | Freq: Two times a day (BID) | CUTANEOUS | Status: DC

## 2015-12-19 NOTE — Progress Notes (Signed)
Pre visit review using our clinic review tool, if applicable. No additional management support is needed unless otherwise documented below in the visit note. 

## 2015-12-19 NOTE — Progress Notes (Signed)
   Subjective:    Patient ID: Alicia Dickson, female    DOB: 03/06/1941, 75 y.o.   MRN: [id number]  HPI Here for 2 months of an intensely itchy rash all over the body, except the face. She has a hx of eczema, but she has never had it this bad before. She saw her dermatologist, Dr. Lavonna Monarch, about 2 weeks ago and he prescribed Dapsone 25 mg to take 2 tabs a day. This has not helped at all. She is also taking Benadryl and soaking in oatmeal baths.    Review of Systems  Constitutional: Negative.   Skin: Positive for rash.       Objective:   Physical Exam  Constitutional: She appears well-developed and well-nourished.  Skin:  Diffuse erythematous scaly rashes over the arms, legs, and trunk           Assessment & Plan:  Atopic eczema. Given a steroid shot. She will take a 16 day prednisone taper and apply betamethasone cream. Add Benadryl prn.  Laurey Morale, MD

## 2016-02-13 ENCOUNTER — Encounter: Payer: Self-pay | Admitting: Family Medicine

## 2016-02-13 ENCOUNTER — Ambulatory Visit (INDEPENDENT_AMBULATORY_CARE_PROVIDER_SITE_OTHER): Payer: Medicare HMO | Admitting: Family Medicine

## 2016-02-13 VITALS — BP 135/82 | HR 91 | Temp 98.4°F | Ht 60.0 in | Wt 94.0 lb

## 2016-02-13 DIAGNOSIS — L209 Atopic dermatitis, unspecified: Secondary | ICD-10-CM

## 2016-02-13 MED ORDER — BETAMETHASONE DIPROPIONATE AUG 0.05 % EX CREA: TOPICAL_CREAM | Freq: Two times a day (BID) | CUTANEOUS | Status: DC

## 2016-02-13 NOTE — Progress Notes (Signed)
Pre visit review using our clinic review tool, if applicable. No additional management support is needed unless otherwise documented below in the visit note. 

## 2016-02-13 NOTE — Progress Notes (Signed)
   Subjective:    Patient ID: Alicia Dickson, female    DOB: 03/26/1941, 75 y.o.   MRN: [id number]  HPI Here to follow up on eczema. In April we gave her a prednisone taper which helped temporarily. Since then she has been applying Diprolene cream and this helps to some extent. She asks if she could be allergic to something in her environment.    Review of Systems  Constitutional: Negative.   Skin: Positive for rash.       Objective:   Physical Exam  Constitutional: She appears well-developed and well-nourished.  Skin:  Wide spread erythematous maculopapular rash          Assessment & Plan:  Eczema, we will refer her to Allergy to evaluate. Now that it is summertime I suggested she go outside to get some sunlight on her skin an hour every day.  Laurey Morale, MD

## 2016-02-24 ENCOUNTER — Other Ambulatory Visit: Payer: Self-pay | Admitting: Family Medicine

## 2016-02-24 ENCOUNTER — Telehealth: Payer: Self-pay | Admitting: Family Medicine

## 2016-02-24 NOTE — Telephone Encounter (Signed)
Pt has been scheduled.  °

## 2016-02-24 NOTE — Telephone Encounter (Signed)
She will need to schedule to see Dr. Sarajane Jews or another provider.

## 2016-02-24 NOTE — Telephone Encounter (Signed)
Pt states her eczema is back and is worse.  Pt saw Dr Sarajane Jews on 6/15 and would like  to know if she can have prednisone refilled. [facility]/ [address]

## 2016-02-28 ENCOUNTER — Encounter: Payer: Self-pay | Admitting: Family Medicine

## 2016-02-28 ENCOUNTER — Ambulatory Visit (INDEPENDENT_AMBULATORY_CARE_PROVIDER_SITE_OTHER): Payer: Medicare HMO | Admitting: Family Medicine

## 2016-02-28 VITALS — BP 155/99 | HR 79 | Temp 97.9°F | Ht 60.0 in | Wt 96.0 lb

## 2016-02-28 DIAGNOSIS — L209 Atopic dermatitis, unspecified: Secondary | ICD-10-CM | POA: Diagnosis not present

## 2016-02-28 MED ORDER — HYDROXYZINE HCL 25 MG PO TABS: 25.0000 mg | ORAL_TABLET | ORAL | Status: DC | PRN

## 2016-02-28 NOTE — Progress Notes (Signed)
   Subjective:    Patient ID: Alicia Dickson, female    DOB: 03-25-1941, 75 y.o.   MRN: [id number]  HPI Here to follow up on eczema which causes intense itching, often keeping her up at night. She is using Diprolene cream and Aveno. She takes a Zyrtec every day and uses Benadryl prn. She has an appt to see Dr. Donneta Romberg for an Allergy consult on 03-20-16.    Review of Systems  Constitutional: Negative.   Skin: Positive for rash.       Objective:   Physical Exam  Constitutional: She appears well-developed and well-nourished.  Skin:  Extensive erythematous pruritic rash           Assessment & Plan:  Atopic eczema, she will see Allergy as above. Try Hydroxyzine instead of Benadryl prn.  Laurey Morale, MD

## 2016-02-28 NOTE — Progress Notes (Signed)
Pre visit review using our clinic review tool, if applicable. No additional management support is needed unless otherwise documented below in the visit note. 

## 2016-03-05 ENCOUNTER — Other Ambulatory Visit: Payer: Self-pay | Admitting: Family Medicine

## 2016-03-10 DIAGNOSIS — H401112 Primary open-angle glaucoma, right eye, moderate stage: Secondary | ICD-10-CM | POA: Diagnosis not present

## 2016-03-20 DIAGNOSIS — R69 Illness, unspecified: Secondary | ICD-10-CM | POA: Diagnosis not present

## 2016-03-20 DIAGNOSIS — J309 Allergic rhinitis, unspecified: Secondary | ICD-10-CM | POA: Diagnosis not present

## 2016-03-20 DIAGNOSIS — J310 Chronic rhinitis: Secondary | ICD-10-CM | POA: Diagnosis not present

## 2016-03-20 DIAGNOSIS — R21 Rash and other nonspecific skin eruption: Secondary | ICD-10-CM | POA: Diagnosis not present

## 2016-04-17 ENCOUNTER — Other Ambulatory Visit: Payer: Self-pay | Admitting: Family Medicine

## 2016-04-20 DIAGNOSIS — L309 Dermatitis, unspecified: Secondary | ICD-10-CM | POA: Diagnosis not present

## 2016-04-23 ENCOUNTER — Encounter: Payer: Self-pay | Admitting: Adult Health

## 2016-04-23 ENCOUNTER — Ambulatory Visit (INDEPENDENT_AMBULATORY_CARE_PROVIDER_SITE_OTHER): Payer: Medicare HMO | Admitting: Adult Health

## 2016-04-23 VITALS — BP 128/64 | Temp 98.4°F | Ht 60.0 in | Wt 89.8 lb

## 2016-04-23 DIAGNOSIS — J209 Acute bronchitis, unspecified: Secondary | ICD-10-CM

## 2016-04-23 MED ORDER — AZITHROMYCIN 250 MG PO TABS: ORAL_TABLET | ORAL | 0 refills | Status: DC

## 2016-04-23 NOTE — Patient Instructions (Addendum)
It was great meeting you today   Your exam is consistent with bronchitis.   I have sent in a prescription for Azithromycin. Take as directed.   You can continue to use Mucinex    Acute Bronchitis Bronchitis is inflammation of the airways that extend from the windpipe into the lungs (bronchi). The inflammation often causes mucus to develop. This leads to a cough, which is the most common symptom of bronchitis.  In acute bronchitis, the condition usually develops suddenly and goes away over time, usually in a couple weeks. Smoking, allergies, and asthma can make bronchitis worse. Repeated episodes of bronchitis may cause further lung problems.  CAUSES Acute bronchitis is most often caused by the same virus that causes a cold. The virus can spread from person to person (contagious) through coughing, sneezing, and touching contaminated objects. SIGNS AND SYMPTOMS   Cough.   Fever.   Coughing up mucus.   Body aches.   Chest congestion.   Chills.   Shortness of breath.   Sore throat.  DIAGNOSIS  Acute bronchitis is usually diagnosed through a physical exam. Your health care provider will also ask you questions about your medical history. Tests, such as chest X-rays, are sometimes done to rule out other conditions.  TREATMENT  Acute bronchitis usually goes away in a couple weeks. Oftentimes, no medical treatment is necessary. Medicines are sometimes given for relief of fever or cough. Antibiotic medicines are usually not needed but may be prescribed in certain situations. In some cases, an inhaler may be recommended to help reduce shortness of breath and control the cough. A cool mist vaporizer may also be used to help thin bronchial secretions and make it easier to clear the chest.  HOME CARE INSTRUCTIONS  Get plenty of rest.   Drink enough fluids to keep your urine clear or pale yellow (unless you have a medical condition that requires fluid restriction). Increasing fluids  may help thin your respiratory secretions (sputum) and reduce chest congestion, and it will prevent dehydration.   Take medicines only as directed by your health care provider.  If you were prescribed an antibiotic medicine, finish it all even if you start to feel better.  Avoid smoking and secondhand smoke. Exposure to cigarette smoke or irritating chemicals will make bronchitis worse. If you are a smoker, consider using nicotine gum or skin patches to help control withdrawal symptoms. Quitting smoking will help your lungs heal faster.   Reduce the chances of another bout of acute bronchitis by washing your hands frequently, avoiding people with cold symptoms, and trying not to touch your hands to your mouth, nose, or eyes.   Keep all follow-up visits as directed by your health care provider.  SEEK MEDICAL CARE IF: Your symptoms do not improve after 1 week of treatment.  SEEK IMMEDIATE MEDICAL CARE IF:  You develop an increased fever or chills.   You have chest pain.   You have severe shortness of breath.  You have bloody sputum.   You develop dehydration.  You faint or repeatedly feel like you are going to pass out.  You develop repeated vomiting.  You develop a severe headache. MAKE SURE YOU:   Understand these instructions.  Will watch your condition.  Will get help right away if you are not doing well or get worse.   This information is not intended to replace advice given to you by your health care provider. Make sure you discuss any questions you have with your health care  provider.   Document Released: 09/24/2004 Document Revised: 09/07/2014 Document Reviewed: 02/07/2013 Elsevier Interactive Patient Education [employer].

## 2016-04-23 NOTE — Progress Notes (Signed)
Subjective:    Patient ID: Alicia Dickson, female    DOB: 1940/12/11, 75 y.o.   MRN: [id number]  Cough  This is a new problem. The current episode started in the past 7 days. The problem has been unchanged. The problem occurs constantly. The cough is non-productive. Associated symptoms include nasal congestion, postnasal drip, rhinorrhea, shortness of breath and wheezing. Pertinent negatives include no chest pain, ear congestion, ear pain, fever or headaches. Risk factors for lung disease include smoking/tobacco exposure. She has tried OTC cough suppressant for the symptoms. Her past medical history is significant for asthma, bronchitis and COPD. There is no history of emphysema.      Review of Systems  Constitutional: Positive for activity change and appetite change. Negative for fever.  HENT: Positive for postnasal drip and rhinorrhea. Negative for ear pain, sinus pressure and tinnitus.   Respiratory: Positive for cough, shortness of breath and wheezing. Negative for chest tightness.   Cardiovascular: Negative for chest pain.  Neurological: Negative.  Negative for headaches.   Past Medical History:  Diagnosis Date  . Anxiety   . Cellulitis of right leg   . Cigarette smoker   . Diverticulosis of colon   . DJD (degenerative joint disease)   . GERD (gastroesophageal reflux disease)   . Glaucoma   . Hyperlipidemia   . Hypertension   . IBS (irritable bowel syndrome)   . Osteopenia     Social History   Social History  . Marital status: Widowed    Spouse name: N/A  . Number of children: 5  . Years of education: N/A   Occupational History  . retired     [employer]   Social History Main Topics  . Smoking status: Current Some Day Smoker    Years: 20.00    Types: Cigarettes    Last attempt to quit: 05/31/2013  . Smokeless tobacco: Never Used     Comment: still trying to quit  . Alcohol use 0.0 oz/week     Comment: social - wine  . Drug use: No  . Sexual activity: Not on file     Other Topics Concern  . Not on file   Social History Narrative  . No narrative on file    Past Surgical History:  Procedure Laterality Date  . APPENDECTOMY    . COLONOSCOPY  07-02-10   per Dr. Henrene Pastor, severe diverticulosis, no polyps, repeat in 5 yrs   . TOTAL ABDOMINAL HYSTERECTOMY      Family History  Problem Relation Age of Onset  . Colon cancer Father   . Hypertension Mother   . Hyperlipidemia Mother     Allergies  Allergen Reactions  . Penicillins     REACTION: rash    Current Outpatient Prescriptions on File Prior to Visit  Medication Sig Dispense Refill  . aspirin 81 MG tablet Take 81 mg by mouth daily.    Marland Kitchen augmented betamethasone dipropionate (DIPROLENE-AF) 0.05 % cream Apply topically 2 (two) times daily. 150 g 5  . brimonidine (ALPHAGAN) 0.15 % ophthalmic solution Place 1 drop into both eyes 2 (two) times daily.    . cetirizine (ZYRTEC) 10 MG tablet Take 10 mg by mouth daily.    . Cholecalciferol (VITAMIN D) 2000 UNITS CAPS Take 1 capsule by mouth daily.      . Cyanocobalamin (VITAMIN B-12 PO) Take by mouth daily.    . dorzolamide-timolol (COSOPT) 22.3-6.8 MG/ML ophthalmic solution Place 1 drop into both eyes 2 (two) times daily.    Marland Kitchen  hydrOXYzine (ATARAX/VISTARIL) 25 MG tablet Take 1 tablet (25 mg total) by mouth every 4 (four) hours as needed for itching. 120 tablet 2  . ibuprofen (ADVIL,MOTRIN) 200 MG tablet Take 400 mg by mouth 2 (two) times daily as needed for mild pain.     Marland Kitchen losartan-hydrochlorothiazide (HYZAAR) 100-25 MG tablet TAKE 1 TABLET BY MOUTH EVERY DAY. 90 tablet 0  . Multiple Vitamin (MULTIVITAMIN) tablet Take 1 tablet by mouth daily.      . predniSONE (DELTASONE) 10 MG tablet Take 4 tabs a day for 4 days, then 3 a day for 4 days, then 2 a day for 4 days, then 1 a day for 4 days, then stop 60 tablet 0  . rosuvastatin (CRESTOR) 10 MG tablet TAKE 1 TABLET BY MOUTH EVERY DAY 90 tablet 1   No current facility-administered medications on file prior  to visit.     BP 128/64   Temp 98.4 F (36.9 C) (Oral)   Ht 5' (1.524 m)   Wt 89 lb 12.8 oz (40.7 kg)   BMI 17.54 kg/m       Objective:   Physical Exam  Constitutional: She is oriented to person, place, and time. She appears well-developed and well-nourished. No distress.  HENT:  Head: Normocephalic and atraumatic.  Right Ear: Hearing, tympanic membrane, external ear and ear canal normal.  Left Ear: Tympanic membrane, external ear and ear canal normal.  Nose: Nose normal. No mucosal edema or rhinorrhea. Right sinus exhibits no maxillary sinus tenderness and no frontal sinus tenderness. Left sinus exhibits no maxillary sinus tenderness and no frontal sinus tenderness.  Mouth/Throat: Uvula is midline, oropharynx is clear and moist and mucous membranes are normal. No oropharyngeal exudate.  Eyes: Conjunctivae and EOM are normal. Pupils are equal, round, and reactive to light. Right eye exhibits no discharge. Left eye exhibits no discharge. No scleral icterus.  Neck: Normal range of motion. Neck supple. No thyromegaly present.  Cardiovascular: Normal rate, regular rhythm, normal heart sounds and intact distal pulses.  Exam reveals no gallop and no friction rub.   No murmur heard. Pulmonary/Chest: Effort normal. No respiratory distress. She has wheezes in the right upper field, the right middle field, the right lower field, the left upper field, the left middle field and the left lower field. She has no rhonchi. She exhibits no tenderness.  Lymphadenopathy:    She has no cervical adenopathy.  Neurological: She is alert and oriented to person, place, and time.  Skin: Skin is warm and dry. No rash noted. She is not diaphoretic. No erythema. No pallor.  Psychiatric: She has a normal mood and affect. Her behavior is normal. Judgment and thought content normal.  Nursing note and vitals reviewed.     Assessment & Plan:  1. Acute bronchitis, unspecified organism - Exam is consistent with  bronchitis probably some degree of COPD.  Since she is a smoker I will give abx. She has glaucoma and cannot take prednisone.  - Continue with Mucinex - Stay hydrated and rest.  - azithromycin (ZITHROMAX Z-PAK) 250 MG tablet; Take 2 tablets on Day 1.  Then take 1 tablet daily.  Dispense: 6 tablet; Refill: 0 - Follow up if no improvement

## 2016-04-30 DIAGNOSIS — L309 Dermatitis, unspecified: Secondary | ICD-10-CM | POA: Diagnosis not present

## 2016-04-30 DIAGNOSIS — L299 Pruritus, unspecified: Secondary | ICD-10-CM | POA: Diagnosis not present

## 2016-05-11 DIAGNOSIS — L309 Dermatitis, unspecified: Secondary | ICD-10-CM | POA: Diagnosis not present

## 2016-05-21 DIAGNOSIS — L309 Dermatitis, unspecified: Secondary | ICD-10-CM | POA: Diagnosis not present

## 2016-06-01 DIAGNOSIS — L309 Dermatitis, unspecified: Secondary | ICD-10-CM | POA: Diagnosis not present

## 2016-06-04 ENCOUNTER — Other Ambulatory Visit (INDEPENDENT_AMBULATORY_CARE_PROVIDER_SITE_OTHER): Payer: Medicare HMO

## 2016-06-04 DIAGNOSIS — Z0000 Encounter for general adult medical examination without abnormal findings: Secondary | ICD-10-CM

## 2016-06-04 LAB — POC URINALSYSI DIPSTICK (AUTOMATED)
BILIRUBIN UA: NEGATIVE
Glucose, UA: NEGATIVE
KETONES UA: NEGATIVE
LEUKOCYTES UA: NEGATIVE
Nitrite, UA: NEGATIVE
PROTEIN UA: NEGATIVE
RBC UA: NEGATIVE
Spec Grav, UA: 1.015
Urobilinogen, UA: 0.2
pH, UA: 5.5

## 2016-06-04 LAB — HEPATIC FUNCTION PANEL
ALBUMIN: 4.1 g/dL (ref 3.5–5.2)
ALK PHOS: 80 U/L (ref 39–117)
ALT: 16 U/L (ref 0–35)
AST: 23 U/L (ref 0–37)
Bilirubin, Direct: 0.1 mg/dL (ref 0.0–0.3)
Total Bilirubin: 0.4 mg/dL (ref 0.2–1.2)
Total Protein: 6.5 g/dL (ref 6.0–8.3)

## 2016-06-04 LAB — LIPID PANEL
CHOLESTEROL: 181 mg/dL (ref 0–200)
HDL: 64.5 mg/dL (ref >39.00)
LDL CALC: 95 mg/dL (ref 0–99)
NONHDL: 116.19
Total CHOL/HDL Ratio: 3
Triglycerides: 108 mg/dL (ref 0.0–149.0)
VLDL: 21.6 mg/dL (ref 0.0–40.0)

## 2016-06-04 LAB — CBC WITH DIFFERENTIAL/PLATELET
BASOS ABS: 0 10*3/uL (ref 0.0–0.1)
Basophils Relative: 0.6 % (ref 0.0–3.0)
EOS PCT: 7.1 % — AB (ref 0.0–5.0)
Eosinophils Absolute: 0.4 10*3/uL (ref 0.0–0.7)
HCT: 38.3 % (ref 36.0–46.0)
HEMOGLOBIN: 12.9 g/dL (ref 12.0–15.0)
LYMPHS PCT: 32.2 % (ref 12.0–46.0)
Lymphs Abs: 1.7 10*3/uL (ref 0.7–4.0)
MCHC: 33.7 g/dL (ref 30.0–36.0)
MCV: 89 fl (ref 78.0–100.0)
MONOS PCT: 8.6 % (ref 3.0–12.0)
Monocytes Absolute: 0.5 10*3/uL (ref 0.1–1.0)
NEUTROS PCT: 51.5 % (ref 43.0–77.0)
Neutro Abs: 2.7 10*3/uL (ref 1.4–7.7)
Platelets: 379 10*3/uL (ref 150.0–400.0)
RBC: 4.31 Mil/uL (ref 3.87–5.11)
RDW: 12.5 % (ref 11.5–15.5)
WBC: 5.3 10*3/uL (ref 4.0–10.5)

## 2016-06-04 LAB — BASIC METABOLIC PANEL
BUN: 21 mg/dL (ref 6–23)
CALCIUM: 9 mg/dL (ref 8.4–10.5)
CO2: 29 mEq/L (ref 19–32)
CREATININE: 0.62 mg/dL (ref 0.40–1.20)
Chloride: 102 mEq/L (ref 96–112)
GFR: 99.28 mL/min (ref >60.00)
Glucose, Bld: 96 mg/dL (ref 70–99)
Potassium: 3.7 mEq/L (ref 3.5–5.1)
Sodium: 139 mEq/L (ref 135–145)

## 2016-06-04 LAB — TSH: TSH: 2.94 u[IU]/mL (ref 0.35–4.50)

## 2016-06-09 DIAGNOSIS — H524 Presbyopia: Secondary | ICD-10-CM | POA: Diagnosis not present

## 2016-06-10 ENCOUNTER — Ambulatory Visit (INDEPENDENT_AMBULATORY_CARE_PROVIDER_SITE_OTHER): Payer: Medicare HMO | Admitting: Family Medicine

## 2016-06-10 ENCOUNTER — Encounter: Payer: Self-pay | Admitting: Family Medicine

## 2016-06-10 VITALS — BP 109/71 | HR 88 | Temp 98.4°F | Ht 60.0 in | Wt 90.0 lb

## 2016-06-10 DIAGNOSIS — Z23 Encounter for immunization: Secondary | ICD-10-CM | POA: Diagnosis not present

## 2016-06-10 DIAGNOSIS — Z0000 Encounter for general adult medical examination without abnormal findings: Secondary | ICD-10-CM | POA: Diagnosis not present

## 2016-06-10 MED ORDER — HYDROXYZINE HCL 25 MG PO TABS: 25.0000 mg | ORAL_TABLET | ORAL | 5 refills | Status: DC | PRN

## 2016-06-10 MED ORDER — BETAMETHASONE DIPROPIONATE AUG 0.05 % EX CREA: TOPICAL_CREAM | Freq: Two times a day (BID) | CUTANEOUS | 5 refills | Status: DC

## 2016-06-10 MED ORDER — LOSARTAN POTASSIUM-HCTZ 50-12.5 MG PO TABS: 1.0000 | ORAL_TABLET | Freq: Every day | ORAL | 3 refills | Status: DC

## 2016-06-10 MED ORDER — ROSUVASTATIN CALCIUM 10 MG PO TABS: 10.0000 mg | ORAL_TABLET | Freq: Every day | ORAL | 11 refills | Status: DC

## 2016-06-10 NOTE — Progress Notes (Signed)
Pre visit review using our clinic review tool, if applicable. No additional management support is needed unless otherwise documented below in the visit note. 

## 2016-06-10 NOTE — Progress Notes (Signed)
   Subjective:    Patient ID: Alicia Dickson, female    DOB: [birth year], 75 y.o.   MRN: [id number]  HPI 75 yr old female for a well exam. She is doing well in general. She does mention that she feels lightheaded for a few seconds when she first stands up from sitting or lying. Also the eczema is slowly going away, though she still itches in spots.    Review of Systems  Constitutional: Negative.   HENT: Negative.   Eyes: Negative.   Respiratory: Negative.   Cardiovascular: Negative.   Gastrointestinal: Negative.   Genitourinary: Negative for decreased urine volume, difficulty urinating, dyspareunia, dysuria, enuresis, flank pain, frequency, hematuria, pelvic pain and urgency.  Musculoskeletal: Negative.   Skin: Positive for rash. Negative for color change, pallor and wound.  Neurological: Positive for light-headedness. Negative for dizziness, tremors, seizures, syncope, facial asymmetry, speech difficulty, weakness, numbness and headaches.  Psychiatric/Behavioral: Negative.        Objective:   Physical Exam  Constitutional: She is oriented to person, place, and time. She appears well-developed and well-nourished. No distress.  HENT:  Head: Normocephalic and atraumatic.  Right Ear: External ear normal.  Left Ear: External ear normal.  Nose: Nose normal.  Mouth/Throat: Oropharynx is clear and moist. No oropharyngeal exudate.  Eyes: Conjunctivae and EOM are normal. Pupils are equal, round, and reactive to light. No scleral icterus.  Neck: Normal range of motion. Neck supple. No JVD present. No thyromegaly present.  Cardiovascular: Normal rate, regular rhythm, normal heart sounds and intact distal pulses.  Exam reveals no gallop and no friction rub.   No murmur heard. EKG is normal   Pulmonary/Chest: Effort normal and breath sounds normal. No respiratory distress. She has no wheezes. She has no rales. She exhibits no tenderness.  Abdominal: Soft. Bowel sounds are normal. She exhibits  no distension and no mass. There is no tenderness. There is no rebound and no guarding.  Musculoskeletal: Normal range of motion. She exhibits no edema or tenderness.  Lymphadenopathy:    She has no cervical adenopathy.  Neurological: She is alert and oriented to person, place, and time. She has normal reflexes. No cranial nerve deficit. She exhibits normal muscle tone. Coordination normal.  Skin: Skin is warm and dry. No rash noted. No erythema.  Psychiatric: She has a normal mood and affect. Her behavior is normal. Judgment and thought content normal.          Assessment & Plan:  Well exam. We discussed diet and exercise. I reminded her to contact Dr. Blanch Media office since she is late for a colonoscopy. She is having some hypotension so we will reduce the dose of her Losartan HCT to 50/12.5 daily.  Laurey Morale, MD

## 2016-06-11 DIAGNOSIS — L309 Dermatitis, unspecified: Secondary | ICD-10-CM | POA: Diagnosis not present

## 2016-06-22 DIAGNOSIS — L299 Pruritus, unspecified: Secondary | ICD-10-CM | POA: Diagnosis not present

## 2016-06-22 DIAGNOSIS — L309 Dermatitis, unspecified: Secondary | ICD-10-CM | POA: Diagnosis not present

## 2016-07-02 DIAGNOSIS — L309 Dermatitis, unspecified: Secondary | ICD-10-CM | POA: Diagnosis not present

## 2016-07-02 DIAGNOSIS — L299 Pruritus, unspecified: Secondary | ICD-10-CM | POA: Diagnosis not present

## 2016-07-16 DIAGNOSIS — L299 Pruritus, unspecified: Secondary | ICD-10-CM | POA: Diagnosis not present

## 2016-07-16 DIAGNOSIS — L309 Dermatitis, unspecified: Secondary | ICD-10-CM | POA: Diagnosis not present

## 2016-07-19 ENCOUNTER — Other Ambulatory Visit: Payer: Self-pay | Admitting: Family Medicine

## 2016-07-30 DIAGNOSIS — L299 Pruritus, unspecified: Secondary | ICD-10-CM | POA: Diagnosis not present

## 2016-07-30 DIAGNOSIS — L309 Dermatitis, unspecified: Secondary | ICD-10-CM | POA: Diagnosis not present

## 2016-08-14 ENCOUNTER — Ambulatory Visit (INDEPENDENT_AMBULATORY_CARE_PROVIDER_SITE_OTHER): Payer: Medicare HMO | Admitting: Family Medicine

## 2016-08-14 ENCOUNTER — Encounter: Payer: Self-pay | Admitting: Family Medicine

## 2016-08-14 VITALS — HR 92 | Temp 98.5°F | Ht 60.0 in | Wt 94.0 lb

## 2016-08-14 DIAGNOSIS — H6593 Unspecified nonsuppurative otitis media, bilateral: Secondary | ICD-10-CM

## 2016-08-14 MED ORDER — HYDROCODONE-HOMATROPINE 5-1.5 MG/5ML PO SYRP: 5.0000 mL | ORAL_SOLUTION | ORAL | 0 refills | Status: DC | PRN

## 2016-08-14 MED ORDER — LEVOFLOXACIN 500 MG PO TABS: 500.0000 mg | ORAL_TABLET | Freq: Every day | ORAL | 0 refills | Status: AC

## 2016-08-14 NOTE — Progress Notes (Signed)
   Subjective:    Patient ID: Alicia Dickson, female    DOB: April 14, 1941, 75 y.o.   MRN: [id number]  HPI Here for one week of pain in both ears, sinus pressure, PND, and a dry cough. No fever.    Review of Systems  Constitutional: Negative.   HENT: Positive for congestion, ear pain and postnasal drip. Negative for sinus pain and sore throat.   Eyes: Negative.   Respiratory: Positive for cough.        Objective:   Physical Exam  Constitutional: She appears well-developed and well-nourished.  HENT:  Nose: Nose normal.  Mouth/Throat: Oropharynx is clear and moist.  Both TMs are red and dull   Eyes: Conjunctivae are normal.  Neck: No thyromegaly present.  Pulmonary/Chest: Effort normal and breath sounds normal. No respiratory distress. She has no wheezes. She has no rales.  Lymphadenopathy:    She has no cervical adenopathy.          Assessment & Plan:  Otitis media, treat with Levaquin. Add Ibuprofen prn.  Laurey Morale, MD

## 2016-08-14 NOTE — Progress Notes (Signed)
Pre visit review using our clinic review tool, if applicable. No additional management support is needed unless otherwise documented below in the visit note. 

## 2016-09-14 DIAGNOSIS — L259 Unspecified contact dermatitis, unspecified cause: Secondary | ICD-10-CM | POA: Diagnosis not present

## 2016-09-15 DIAGNOSIS — L259 Unspecified contact dermatitis, unspecified cause: Secondary | ICD-10-CM | POA: Diagnosis not present

## 2016-09-28 DIAGNOSIS — H938X3 Other specified disorders of ear, bilateral: Secondary | ICD-10-CM | POA: Diagnosis not present

## 2016-10-07 DIAGNOSIS — Z7982 Long term (current) use of aspirin: Secondary | ICD-10-CM | POA: Diagnosis not present

## 2016-10-07 DIAGNOSIS — H409 Unspecified glaucoma: Secondary | ICD-10-CM | POA: Diagnosis not present

## 2016-10-07 DIAGNOSIS — H9113 Presbycusis, bilateral: Secondary | ICD-10-CM | POA: Diagnosis not present

## 2016-10-07 DIAGNOSIS — J309 Allergic rhinitis, unspecified: Secondary | ICD-10-CM | POA: Diagnosis not present

## 2016-10-07 DIAGNOSIS — Z0000 Encounter for general adult medical examination without abnormal findings: Secondary | ICD-10-CM | POA: Diagnosis not present

## 2016-10-07 DIAGNOSIS — I10 Essential (primary) hypertension: Secondary | ICD-10-CM | POA: Diagnosis not present

## 2016-10-07 DIAGNOSIS — R69 Illness, unspecified: Secondary | ICD-10-CM | POA: Diagnosis not present

## 2016-10-07 DIAGNOSIS — E7800 Pure hypercholesterolemia, unspecified: Secondary | ICD-10-CM | POA: Diagnosis not present

## 2016-10-21 DIAGNOSIS — H401112 Primary open-angle glaucoma, right eye, moderate stage: Secondary | ICD-10-CM | POA: Diagnosis not present

## 2017-01-05 ENCOUNTER — Ambulatory Visit (INDEPENDENT_AMBULATORY_CARE_PROVIDER_SITE_OTHER): Payer: Medicare HMO | Admitting: Family Medicine

## 2017-01-05 ENCOUNTER — Encounter: Payer: Self-pay | Admitting: Family Medicine

## 2017-01-05 VITALS — BP 128/70 | Temp 98.2°F | Ht 60.0 in | Wt 96.0 lb

## 2017-01-05 DIAGNOSIS — M436 Torticollis: Secondary | ICD-10-CM

## 2017-01-05 MED ORDER — CYCLOBENZAPRINE HCL 10 MG PO TABS: 10.0000 mg | ORAL_TABLET | Freq: Three times a day (TID) | ORAL | 2 refills | Status: DC | PRN

## 2017-01-05 MED ORDER — DICLOFENAC SODIUM 75 MG PO TBEC: 75.0000 mg | DELAYED_RELEASE_TABLET | Freq: Two times a day (BID) | ORAL | 2 refills | Status: DC

## 2017-01-05 NOTE — Progress Notes (Signed)
   Subjective:    Patient ID: Alicia Dickson, female    DOB: April 27, 1941, 76 y.o.   MRN: [id number]  HPI Here for 5 days of stiffness and pain in the neck. No trauma but she woke up this way one morning. The pain radiates to both shoulders and upper arms. Some tingling in the hands. Using heat and Advil.    Review of Systems  Constitutional: Negative.   Respiratory: Negative.   Cardiovascular: Negative.   Musculoskeletal: Positive for neck pain and neck stiffness.  Neurological: Positive for numbness. Negative for weakness.       Objective:   Physical Exam  Constitutional: She appears well-developed and well-nourished.  Cardiovascular: Normal rate, regular rhythm, normal heart sounds and intact distal pulses.   Pulmonary/Chest: Effort normal and breath sounds normal.  Musculoskeletal:  Mildly tender in the lower posterior neck, some spasm is noted. ROM is full           Assessment & Plan:  Torticollis. Try Diclofenac and Flexeril. Recheck prn. [name], MD

## 2017-01-05 NOTE — Patient Instructions (Signed)
WE NOW OFFER   [facility] FAST TRACK!!!  SAME DAY Appointments for ACUTE CARE  Such as: Sprains, Injuries, cuts, abrasions, rashes, muscle pain, joint pain, back pain Colds, flu, sore throats, headache, allergies, cough, fever  Ear pain, sinus and eye infections Abdominal pain, nausea, vomiting, diarrhea, upset stomach Animal/insect bites  3 Easy Ways to Schedule: Walk-In Scheduling Call in scheduling Mychart Sign-up: [email or web address]         

## 2017-01-21 DIAGNOSIS — H401112 Primary open-angle glaucoma, right eye, moderate stage: Secondary | ICD-10-CM | POA: Diagnosis not present

## 2017-02-10 DIAGNOSIS — L209 Atopic dermatitis, unspecified: Secondary | ICD-10-CM | POA: Diagnosis not present

## 2017-03-15 DIAGNOSIS — L309 Dermatitis, unspecified: Secondary | ICD-10-CM | POA: Diagnosis not present

## 2017-03-25 DIAGNOSIS — H401112 Primary open-angle glaucoma, right eye, moderate stage: Secondary | ICD-10-CM | POA: Diagnosis not present

## 2017-03-25 DIAGNOSIS — L309 Dermatitis, unspecified: Secondary | ICD-10-CM | POA: Diagnosis not present

## 2017-04-08 DIAGNOSIS — L309 Dermatitis, unspecified: Secondary | ICD-10-CM | POA: Diagnosis not present

## 2017-04-19 DIAGNOSIS — L309 Dermatitis, unspecified: Secondary | ICD-10-CM | POA: Diagnosis not present

## 2017-05-04 DIAGNOSIS — L309 Dermatitis, unspecified: Secondary | ICD-10-CM | POA: Diagnosis not present

## 2017-05-25 DIAGNOSIS — L309 Dermatitis, unspecified: Secondary | ICD-10-CM | POA: Diagnosis not present

## 2017-06-14 ENCOUNTER — Encounter: Payer: Self-pay | Admitting: Obstetrics & Gynecology

## 2017-06-14 ENCOUNTER — Ambulatory Visit (INDEPENDENT_AMBULATORY_CARE_PROVIDER_SITE_OTHER): Payer: Medicare HMO | Admitting: Obstetrics & Gynecology

## 2017-06-14 VITALS — BP 148/88 | Ht 59.5 in | Wt 94.6 lb

## 2017-06-14 DIAGNOSIS — Z01411 Encounter for gynecological examination (general) (routine) with abnormal findings: Secondary | ICD-10-CM

## 2017-06-14 DIAGNOSIS — Z90710 Acquired absence of both cervix and uterus: Secondary | ICD-10-CM

## 2017-06-14 DIAGNOSIS — Z1272 Encounter for screening for malignant neoplasm of vagina: Secondary | ICD-10-CM | POA: Diagnosis not present

## 2017-06-14 DIAGNOSIS — Z780 Asymptomatic menopausal state: Secondary | ICD-10-CM

## 2017-06-14 NOTE — Addendum Note (Signed)
Addended by: Thurnell Garbe A on: 06/14/2017 03:21 PM   Modules accepted: Orders

## 2017-06-14 NOTE — Patient Instructions (Signed)
1. Encounter for gynecological examination with abnormal finding Gyn exam s/p Hysterectomy and Atrophic Vaginitis of Menopause.  Pap reflex done, will repeat in 5 years if normal.  Breasts wnl.  Will schedule screening Mammo.  2. Menopause present Asymptomatic.  No HRT.  Vit D supplement/Ca++ in nutrition/Weight bearing physical activity.  Schedule Bone Density. - DG Bone Density; Future  3. H/O total hysterectomy  Alicia Dickson, it was a pleasure to see you today!  I will inform you of your results as soon as available.   Health Maintenance for Postmenopausal Women Menopause is a normal process in which your reproductive ability comes to an end. This process happens gradually over a span of months to years, usually between the ages of 32 and 72. Menopause is complete when you have missed 12 consecutive menstrual periods. It is important to talk with your health care provider about some of the most common conditions that affect postmenopausal women, such as heart disease, cancer, and bone loss (osteoporosis). Adopting a healthy lifestyle and getting preventive care can help to promote your health and wellness. Those actions can also lower your chances of developing some of these common conditions. What should I know about menopause? During menopause, you may experience a number of symptoms, such as:  Moderate-to-severe hot flashes.  Night sweats.  Decrease in sex drive.  Mood swings.  Headaches.  Tiredness.  Irritability.  Memory problems.  Insomnia.  Choosing to treat or not to treat menopausal changes is an individual decision that you make with your health care provider. What should I know about hormone replacement therapy and supplements? Hormone therapy products are effective for treating symptoms that are associated with menopause, such as hot flashes and night sweats. Hormone replacement carries certain risks, especially as you become older. If you are thinking about using  estrogen or estrogen with progestin treatments, discuss the benefits and risks with your health care provider. What should I know about heart disease and stroke? Heart disease, heart attack, and stroke become more likely as you age. This may be due, in part, to the hormonal changes that your body experiences during menopause. These can affect how your body processes dietary fats, triglycerides, and cholesterol. Heart attack and stroke are both medical emergencies. There are many things that you can do to help prevent heart disease and stroke:  Have your blood pressure checked at least every 1-2 years. High blood pressure causes heart disease and increases the risk of stroke.  If you are 25-55 years old, ask your health care provider if you should take aspirin to prevent a heart attack or a stroke.  Do not use any tobacco products, including cigarettes, chewing tobacco, or electronic cigarettes. If you need help quitting, ask your health care provider.  It is important to eat a healthy diet and maintain a healthy weight. ? Be sure to include plenty of vegetables, fruits, low-fat dairy products, and lean protein. ? Avoid eating foods that are high in solid fats, added sugars, or salt (sodium).  Get regular exercise. This is one of the most important things that you can do for your health. ? Try to exercise for at least 150 minutes each week. The type of exercise that you do should increase your heart rate and make you sweat. This is known as moderate-intensity exercise. ? Try to do strengthening exercises at least twice each week. Do these in addition to the moderate-intensity exercise.  Know your numbers.Ask your health care provider to check your cholesterol and  your blood glucose. Continue to have your blood tested as directed by your health care provider.  What should I know about cancer screening? There are several types of cancer. Take the following steps to reduce your risk and to catch  any cancer development as early as possible. Breast Cancer  Practice breast self-awareness. ? This means understanding how your breasts normally appear and feel. ? It also means doing regular breast self-exams. Let your health care provider know about any changes, no matter how small.  If you are 62 or older, have a clinician do a breast exam (clinical breast exam or CBE) every year. Depending on your age, family history, and medical history, it may be recommended that you also have a yearly breast X-ray (mammogram).  If you have a family history of breast cancer, talk with your health care provider about genetic screening.  If you are at high risk for breast cancer, talk with your health care provider about having an MRI and a mammogram every year.  Breast cancer (BRCA) gene test is recommended for women who have family members with BRCA-related cancers. Results of the assessment will determine the need for genetic counseling and BRCA1 and for BRCA2 testing. BRCA-related cancers include these types: ? Breast. This occurs in males or females. ? Ovarian. ? Tubal. This may also be called fallopian tube cancer. ? Cancer of the abdominal or pelvic lining (peritoneal cancer). ? Prostate. ? Pancreatic.  Cervical, Uterine, and Ovarian Cancer Your health care provider may recommend that you be screened regularly for cancer of the pelvic organs. These include your ovaries, uterus, and vagina. This screening involves a pelvic exam, which includes checking for microscopic changes to the surface of your cervix (Pap test).  For women ages 21-65, health care providers may recommend a pelvic exam and a Pap test every three years. For women ages 67-65, they may recommend the Pap test and pelvic exam, combined with testing for human papilloma virus (HPV), every five years. Some types of HPV increase your risk of cervical cancer. Testing for HPV may also be done on women of any age who have unclear Pap test  results.  Other health care providers may not recommend any screening for nonpregnant women who are considered low risk for pelvic cancer and have no symptoms. Ask your health care provider if a screening pelvic exam is right for you.  If you have had past treatment for cervical cancer or a condition that could lead to cancer, you need Pap tests and screening for cancer for at least 20 years after your treatment. If Pap tests have been discontinued for you, your risk factors (such as having a new sexual partner) need to be reassessed to determine if you should start having screenings again. Some women have medical problems that increase the chance of getting cervical cancer. In these cases, your health care provider may recommend that you have screening and Pap tests more often.  If you have a family history of uterine cancer or ovarian cancer, talk with your health care provider about genetic screening.  If you have vaginal bleeding after reaching menopause, tell your health care provider.  There are currently no reliable tests available to screen for ovarian cancer.  Lung Cancer Lung cancer screening is recommended for adults 87-65 years old who are at high risk for lung cancer because of a history of smoking. A yearly low-dose CT scan of the lungs is recommended if you:  Currently smoke.  Have a history  of at least 30 pack-years of smoking and you currently smoke or have quit within the past 15 years. A pack-year is smoking an average of one pack of cigarettes per day for one year.  Yearly screening should:  Continue until it has been 15 years since you quit.  Stop if you develop a health problem that would prevent you from having lung cancer treatment.  Colorectal Cancer  This type of cancer can be detected and can often be prevented.  Routine colorectal cancer screening usually begins at age 49 and continues through age 33.  If you have risk factors for colon cancer, your health  care provider may recommend that you be screened at an earlier age.  If you have a family history of colorectal cancer, talk with your health care provider about genetic screening.  Your health care provider may also recommend using home test kits to check for hidden blood in your stool.  A small camera at the end of a tube can be used to examine your colon directly (sigmoidoscopy or colonoscopy). This is done to check for the earliest forms of colorectal cancer.  Direct examination of the colon should be repeated every 5-10 years until age 29. However, if early forms of precancerous polyps or small growths are found or if you have a family history or genetic risk for colorectal cancer, you may need to be screened more often.  Skin Cancer  Check your skin from head to toe regularly.  Monitor any moles. Be sure to tell your health care provider: ? About any new moles or changes in moles, especially if there is a change in a mole's shape or color. ? If you have a mole that is larger than the size of a pencil eraser.  If any of your family members has a history of skin cancer, especially at a young age, talk with your health care provider about genetic screening.  Always use sunscreen. Apply sunscreen liberally and repeatedly throughout the day.  Whenever you are outside, protect yourself by wearing long sleeves, pants, a wide-brimmed hat, and sunglasses.  What should I know about osteoporosis? Osteoporosis is a condition in which bone destruction happens more quickly than new bone creation. After menopause, you may be at an increased risk for osteoporosis. To help prevent osteoporosis or the bone fractures that can happen because of osteoporosis, the following is recommended:  If you are 24-4 years old, get at least 1,000 mg of calcium and at least 600 mg of vitamin D per day.  If you are older than age 18 but younger than age 24, get at least 1,200 mg of calcium and at least 600 mg of  vitamin D per day.  If you are older than age 75, get at least 1,200 mg of calcium and at least 800 mg of vitamin D per day.  Smoking and excessive alcohol intake increase the risk of osteoporosis. Eat foods that are rich in calcium and vitamin D, and do weight-bearing exercises several times each week as directed by your health care provider. What should I know about how menopause affects my mental health? Depression may occur at any age, but it is more common as you become older. Common symptoms of depression include:  Low or sad mood.  Changes in sleep patterns.  Changes in appetite or eating patterns.  Feeling an overall lack of motivation or enjoyment of activities that you previously enjoyed.  Frequent crying spells.  Talk with your health care provider  if you think that you are experiencing depression. What should I know about immunizations? It is important that you get and maintain your immunizations. These include:  Tetanus, diphtheria, and pertussis (Tdap) booster vaccine.  Influenza every year before the flu season begins.  Pneumonia vaccine.  Shingles vaccine.  Your health care provider may also recommend other immunizations. This information is not intended to replace advice given to you by your health care provider. Make sure you discuss any questions you have with your health care provider. Document Released: 10/09/2005 Document Revised: 03/06/2016 Document Reviewed: 05/21/2015 [employer] Interactive Patient Education  2018 [employer]  

## 2017-06-14 NOTE — Progress Notes (Signed)
Alicia Dickson [birth year] [id number]   History:    76 y.o. G5P5 Widowed.  Alicia Dickson is her daughter.  Moving to [facility], patient, Alicia Dickson and her husband.  RP:  Established patient presenting for annual gyn exam   HPI:  Menopause, no HRT.  S/P Hysterectomy.  Abstinent x 20 yrs.  Very active.  Eating well.  BMI 18.79.  Breasts wnl.  Mictions/BMs wnl.  Followed by Dermato for Eczema, doing laser treatment.  Past medical history,surgical history, family history and social history were all reviewed and documented in the EPIC chart.  Gynecologic History No LMP recorded. Patient has had a hysterectomy. Contraception: status post hysterectomy Last Pap: 06/2015. Results were: normal Last mammogram: 06/2015. Results were: normal Colono2011, Sigmoid 2012. No recent Bone Density  Obstetric History OB History  Gravida Para Term Preterm AB Living  5 5       5   SAB TAB Ectopic Multiple Live Births               # Outcome Date GA Lbr Len/2nd Weight Sex Delivery Anes PTL Lv  5 Para           4 Para           3 Para           2 Para           1 Para                ROS: A ROS was performed and pertinent positives and negatives are included in the history.  GENERAL: No fevers or chills. HEENT: No change in vision, no earache, sore throat or sinus congestion. NECK: No pain or stiffness. CARDIOVASCULAR: No chest pain or pressure. No palpitations. PULMONARY: No shortness of breath, cough or wheeze. GASTROINTESTINAL: No abdominal pain, nausea, vomiting or diarrhea, melena or bright red blood per rectum. GENITOURINARY: No urinary frequency, urgency, hesitancy or dysuria. MUSCULOSKELETAL: No joint or muscle pain, no back pain, no recent trauma. DERMATOLOGIC: No rash, no itching, no lesions. ENDOCRINE: No polyuria, polydipsia, no heat or cold intolerance. No recent change in weight. HEMATOLOGICAL: No anemia or easy bruising or bleeding. NEUROLOGIC: No headache, seizures, numbness, tingling or  weakness. PSYCHIATRIC: No depression, no loss of interest in normal activity or change in sleep pattern.     Exam:   BP (!) 148/88   Ht 4' 11.5" (1.511 m)   Wt 94 lb 9.6 oz (42.9 kg)   BMI 18.79 kg/m   Body mass index is 18.79 kg/m.  General appearance : Well developed well nourished female. No acute distress HEENT: Eyes: no retinal hemorrhage or exudates,  Neck supple, trachea midline, no carotid bruits, no thyroidmegaly Lungs: Clear to auscultation, no rhonchi or wheezes, or rib retractions  Heart: Regular rate and rhythm, no murmurs or gallops Breast:Examined in sitting and supine position were symmetrical in appearance, no palpable masses or tenderness,  no skin retraction, no nipple inversion, no nipple discharge, no skin discoloration, no axillary or supraclavicular lymphadenopathy Abdomen: no palpable masses or tenderness, no rebound or guarding Extremities: no edema or skin discoloration or tenderness  Pelvic: Vulva normal  Bartholin, Urethra, Skene Glands: Within normal limits             Vagina: No gross lesions or discharge.  Pap reflex done.  Cervix/Uterus absent  Adnexa  Without masses or tenderness  Anus and perineum  normal    Assessment/Plan:  76 y.o. female for annual exam  1. Encounter for gynecological examination with abnormal finding Gyn exam s/p Hysterectomy and Atrophic Vaginitis of Menopause.  Pap reflex done, will repeat in 5 years if normal.  Breasts wnl.  Will schedule screening Mammo.  2. Menopause present Asymptomatic.  No HRT.  Vit D supplement/Ca++ in nutrition/Weight bearing physical activity.  Schedule Bone Density. - DG Bone Density; Future  3. H/O total hysterectomy   Alicia Bruins MD, 2:29 PM 06/14/2017

## 2017-06-15 DIAGNOSIS — H524 Presbyopia: Secondary | ICD-10-CM | POA: Diagnosis not present

## 2017-06-15 DIAGNOSIS — L309 Dermatitis, unspecified: Secondary | ICD-10-CM | POA: Diagnosis not present

## 2017-06-15 LAB — PAP IG W/ RFLX HPV ASCU

## 2017-06-16 ENCOUNTER — Ambulatory Visit (INDEPENDENT_AMBULATORY_CARE_PROVIDER_SITE_OTHER): Payer: Medicare HMO | Admitting: Family Medicine

## 2017-06-16 ENCOUNTER — Encounter: Payer: Self-pay | Admitting: Family Medicine

## 2017-06-16 VITALS — BP 120/68 | Temp 97.7°F | Ht 59.5 in | Wt 94.0 lb

## 2017-06-16 DIAGNOSIS — Z23 Encounter for immunization: Secondary | ICD-10-CM

## 2017-06-16 DIAGNOSIS — E782 Mixed hyperlipidemia: Secondary | ICD-10-CM | POA: Diagnosis not present

## 2017-06-16 DIAGNOSIS — I10 Essential (primary) hypertension: Secondary | ICD-10-CM

## 2017-06-16 DIAGNOSIS — L209 Atopic dermatitis, unspecified: Secondary | ICD-10-CM | POA: Diagnosis not present

## 2017-06-16 DIAGNOSIS — K582 Mixed irritable bowel syndrome: Secondary | ICD-10-CM | POA: Diagnosis not present

## 2017-06-16 LAB — CBC WITH DIFFERENTIAL/PLATELET
BASOS ABS: 0 10*3/uL (ref 0.0–0.1)
Basophils Relative: 0.3 % (ref 0.0–3.0)
EOS PCT: 3.3 % (ref 0.0–5.0)
Eosinophils Absolute: 0.3 10*3/uL (ref 0.0–0.7)
HEMATOCRIT: 41.9 % (ref 36.0–46.0)
Hemoglobin: 14.3 g/dL (ref 12.0–15.0)
LYMPHS ABS: 2.1 10*3/uL (ref 0.7–4.0)
LYMPHS PCT: 25.4 % (ref 12.0–46.0)
MCHC: 34 g/dL (ref 30.0–36.0)
MCV: 93 fl (ref 78.0–100.0)
MONOS PCT: 6.4 % (ref 3.0–12.0)
Monocytes Absolute: 0.5 10*3/uL (ref 0.1–1.0)
NEUTROS PCT: 64.6 % (ref 43.0–77.0)
Neutro Abs: 5.4 10*3/uL (ref 1.4–7.7)
Platelets: 340 10*3/uL (ref 150.0–400.0)
RBC: 4.51 Mil/uL (ref 3.87–5.11)
RDW: 12.3 % (ref 11.5–15.5)
WBC: 8.4 10*3/uL (ref 4.0–10.5)

## 2017-06-16 LAB — HEPATIC FUNCTION PANEL
ALT: 14 U/L (ref 0–35)
AST: 25 U/L (ref 0–37)
Albumin: 4.5 g/dL (ref 3.5–5.2)
Alkaline Phosphatase: 82 U/L (ref 39–117)
BILIRUBIN DIRECT: 0.1 mg/dL (ref 0.0–0.3)
BILIRUBIN TOTAL: 0.6 mg/dL (ref 0.2–1.2)
TOTAL PROTEIN: 6.8 g/dL (ref 6.0–8.3)

## 2017-06-16 LAB — TSH: TSH: 2.82 u[IU]/mL (ref 0.35–4.50)

## 2017-06-16 LAB — LIPID PANEL
CHOL/HDL RATIO: 2
Cholesterol: 162 mg/dL (ref 0–200)
HDL: 65.6 mg/dL (ref >39.00)
LDL CALC: 78 mg/dL (ref 0–99)
NonHDL: 96.53
Triglycerides: 95 mg/dL (ref 0.0–149.0)
VLDL: 19 mg/dL (ref 0.0–40.0)

## 2017-06-16 LAB — BASIC METABOLIC PANEL
BUN: 19 mg/dL (ref 6–23)
CHLORIDE: 92 meq/L — AB (ref 96–112)
CO2: 27 mEq/L (ref 19–32)
Calcium: 9.6 mg/dL (ref 8.4–10.5)
Creatinine, Ser: 0.67 mg/dL (ref 0.40–1.20)
GFR: 90.54 mL/min (ref >60.00)
GLUCOSE: 92 mg/dL (ref 70–99)
POTASSIUM: 4.2 meq/L (ref 3.5–5.1)
SODIUM: 128 meq/L — AB (ref 135–145)

## 2017-06-16 MED ORDER — HALOBETASOL PROPIONATE 0.05 % EX CREA: TOPICAL_CREAM | Freq: Two times a day (BID) | CUTANEOUS | 5 refills | Status: AC

## 2017-06-16 MED ORDER — ROSUVASTATIN CALCIUM 10 MG PO TABS: 10.0000 mg | ORAL_TABLET | Freq: Every day | ORAL | 3 refills | Status: DC

## 2017-06-16 MED ORDER — HALOBETASOL PROPIONATE 0.05 % EX CREA: TOPICAL_CREAM | Freq: Two times a day (BID) | CUTANEOUS | 0 refills | Status: DC

## 2017-06-16 MED ORDER — LOSARTAN POTASSIUM-HCTZ 50-12.5 MG PO TABS: 1.0000 | ORAL_TABLET | Freq: Every day | ORAL | 3 refills | Status: DC

## 2017-06-16 NOTE — Patient Instructions (Signed)
WE NOW OFFER   [facility] FAST TRACK!!!  SAME DAY Appointments for ACUTE CARE  Such as: Sprains, Injuries, cuts, abrasions, rashes, muscle pain, joint pain, back pain Colds, flu, sore throats, headache, allergies, cough, fever  Ear pain, sinus and eye infections Abdominal pain, nausea, vomiting, diarrhea, upset stomach Animal/insect bites  3 Easy Ways to Schedule: Walk-In Scheduling Call in scheduling Mychart Sign-up: [email or web address]         

## 2017-06-16 NOTE — Progress Notes (Signed)
   Subjective:    Patient ID: Alicia Dickson, female    DOB: Mar 01, 1941, 76 y.o.   MRN: [id number]  HPI Here to follow up on issues. She feels well in general. She recently saw Dr. Dellis Filbert for a GYN exam and she has ordered a bone density test. The eczema on her legs has been bothering her because she could not afford to get the Diprolene AF cream we prescribed for her. Her BP is stable. Her GERD is stable.    Review of Systems  Constitutional: Negative.   HENT: Negative.   Eyes: Negative.   Respiratory: Negative.   Cardiovascular: Negative.   Gastrointestinal: Negative.   Genitourinary: Negative for decreased urine volume, difficulty urinating, dyspareunia, dysuria, enuresis, flank pain, frequency, hematuria, pelvic pain and urgency.  Musculoskeletal: Negative.   Skin: Positive for rash. Negative for color change, pallor and wound.  Neurological: Negative.   Psychiatric/Behavioral: Negative.        Objective:   Physical Exam  Constitutional: She is oriented to person, place, and time. She appears well-developed and well-nourished. No distress.  HENT:  Head: Normocephalic and atraumatic.  Right Ear: External ear normal.  Left Ear: External ear normal.  Nose: Nose normal.  Mouth/Throat: Oropharynx is clear and moist. No oropharyngeal exudate.  Eyes: Pupils are equal, round, and reactive to light. Conjunctivae and EOM are normal. No scleral icterus.  Neck: Normal range of motion. Neck supple. No JVD present. No thyromegaly present.  Cardiovascular: Normal rate, regular rhythm, normal heart sounds and intact distal pulses.  Exam reveals no gallop and no friction rub.   No murmur heard. Pulmonary/Chest: Effort normal and breath sounds normal. No respiratory distress. She has no wheezes. She has no rales. She exhibits no tenderness.  Abdominal: Soft. Bowel sounds are normal. She exhibits no distension and no mass. There is no tenderness. There is no rebound and no guarding.    Musculoskeletal: Normal range of motion. She exhibits no edema or tenderness.  Lymphadenopathy:    She has no cervical adenopathy.  Neurological: She is alert and oriented to person, place, and time. She has normal reflexes. No cranial nerve deficit. She exhibits normal muscle tone. Coordination normal.  Skin: Skin is warm and dry. No erythema.  Patches of red scaly excoriated skin on both lower legs   Psychiatric: She has a normal mood and affect. Her behavior is normal. Judgment and thought content normal.          Assessment & Plan:  Her HTN is stable. GERD is well controlled. Get fasting labs today to check her lipids, etc. She declines to have any more colonoscopies. She cannot afford Hydroxyzine so she uses Benadryl for chronic itching.  [name], MD

## 2017-06-17 ENCOUNTER — Other Ambulatory Visit: Payer: Self-pay | Admitting: Obstetrics & Gynecology

## 2017-06-17 DIAGNOSIS — Z1231 Encounter for screening mammogram for malignant neoplasm of breast: Secondary | ICD-10-CM

## 2017-07-12 ENCOUNTER — Other Ambulatory Visit: Payer: Self-pay | Admitting: Family Medicine

## 2017-07-12 DIAGNOSIS — L309 Dermatitis, unspecified: Secondary | ICD-10-CM | POA: Diagnosis not present

## 2017-07-13 ENCOUNTER — Ambulatory Visit
Admission: RE | Admit: 2017-07-13 | Discharge: 2017-07-13 | Disposition: A | Discharge status: Home / Self Care | Payer: Medicare HMO | Source: Ambulatory Visit | Attending: Obstetrics & Gynecology | Admitting: Obstetrics & Gynecology

## 2017-07-13 DIAGNOSIS — Z1231 Encounter for screening mammogram for malignant neoplasm of breast: Secondary | ICD-10-CM | POA: Diagnosis not present

## 2017-07-21 DIAGNOSIS — L209 Atopic dermatitis, unspecified: Secondary | ICD-10-CM | POA: Diagnosis not present

## 2017-09-07 DIAGNOSIS — L309 Dermatitis, unspecified: Secondary | ICD-10-CM | POA: Diagnosis not present

## 2017-10-02 DIAGNOSIS — R404 Transient alteration of awareness: Secondary | ICD-10-CM | POA: Diagnosis not present

## 2017-10-02 DIAGNOSIS — R42 Dizziness and giddiness: Secondary | ICD-10-CM | POA: Diagnosis not present

## 2017-10-05 ENCOUNTER — Encounter: Payer: Self-pay | Admitting: Family Medicine

## 2017-10-05 ENCOUNTER — Ambulatory Visit (INDEPENDENT_AMBULATORY_CARE_PROVIDER_SITE_OTHER): Payer: Medicare HMO | Admitting: Family Medicine

## 2017-10-05 VITALS — BP 148/80 | HR 82 | Temp 98.2°F | Wt 93.6 lb

## 2017-10-05 DIAGNOSIS — R6889 Other general symptoms and signs: Secondary | ICD-10-CM

## 2017-10-05 DIAGNOSIS — I10 Essential (primary) hypertension: Secondary | ICD-10-CM | POA: Diagnosis not present

## 2017-10-05 LAB — POC INFLUENZA A&B (BINAX/QUICKVUE)
INFLUENZA A, POC: NEGATIVE
INFLUENZA B, POC: NEGATIVE

## 2017-10-05 NOTE — Progress Notes (Signed)
   Subjective:    Patient ID: Alicia Dickson, female    DOB: 1941-02-27, 77 y.o.   MRN: [id number]  HPI Here to follow up on BP. 4 days ago she developed body aches, vomiting, and diarrhea for about 24 hours. She became very weak and almost passed out. Her daughter called EMS and they treated Alicia Dickson at her home. Her BP initially was in the 50s over 30s., so they figured she was deydrated. They gave her a bag of IV fluids and her BP came back up to normal. They gave her some nausea medication through the IV as well. She quickly felt better. Since then she has been eating and drinking normally and she feels fine. She is still taking her Losartan HCT daily.    Review of Systems  Constitutional: Negative.   Respiratory: Negative.   Cardiovascular: Negative.   Gastrointestinal: Negative.   Neurological: Negative.        Objective:   Physical Exam  Constitutional: She is oriented to person, place, and time. She appears well-developed and well-nourished.  Cardiovascular: Normal rate, regular rhythm, normal heart sounds and intact distal pulses.  Pulmonary/Chest: Effort normal and breath sounds normal. No respiratory distress. She has no wheezes. She has no rales.  Musculoskeletal: She exhibits no edema.  Neurological: She is alert and oriented to person, place, and time.          Assessment & Plan:  It appears she had a viral illness that caused her to get dehydrated. This was corrected by the EMS crew, and she seems to be fully recovered now. Her BP is back to a normal range. She will follow up prn.  [name], MD

## 2017-10-08 DIAGNOSIS — L299 Pruritus, unspecified: Secondary | ICD-10-CM | POA: Diagnosis not present

## 2017-10-08 DIAGNOSIS — L2084 Intrinsic (allergic) eczema: Secondary | ICD-10-CM | POA: Diagnosis not present

## 2017-10-08 DIAGNOSIS — L853 Xerosis cutis: Secondary | ICD-10-CM | POA: Diagnosis not present

## 2017-10-12 ENCOUNTER — Emergency Department (HOSPITAL_COMMUNITY): Payer: Medicare HMO

## 2017-10-12 ENCOUNTER — Other Ambulatory Visit: Payer: Self-pay | Admitting: Family Medicine

## 2017-10-12 ENCOUNTER — Encounter (HOSPITAL_COMMUNITY): Payer: Self-pay | Admitting: Emergency Medicine

## 2017-10-12 DIAGNOSIS — Z7982 Long term (current) use of aspirin: Secondary | ICD-10-CM | POA: Diagnosis not present

## 2017-10-12 DIAGNOSIS — R0602 Shortness of breath: Secondary | ICD-10-CM | POA: Diagnosis not present

## 2017-10-12 DIAGNOSIS — Z79899 Other long term (current) drug therapy: Secondary | ICD-10-CM | POA: Diagnosis not present

## 2017-10-12 DIAGNOSIS — F17210 Nicotine dependence, cigarettes, uncomplicated: Secondary | ICD-10-CM | POA: Diagnosis not present

## 2017-10-12 DIAGNOSIS — I10 Essential (primary) hypertension: Secondary | ICD-10-CM | POA: Insufficient documentation

## 2017-10-12 DIAGNOSIS — I160 Hypertensive urgency: Secondary | ICD-10-CM | POA: Diagnosis not present

## 2017-10-12 DIAGNOSIS — R69 Illness, unspecified: Secondary | ICD-10-CM | POA: Diagnosis not present

## 2017-10-12 LAB — BASIC METABOLIC PANEL
Anion gap: 12 (ref 5–15)
BUN: 15 mg/dL (ref 6–20)
CHLORIDE: 99 mmol/L — AB (ref 101–111)
CO2: 22 mmol/L (ref 22–32)
CREATININE: 0.64 mg/dL (ref 0.44–1.00)
Calcium: 9 mg/dL (ref 8.9–10.3)
GFR calc non Af Amer: >60 mL/min (ref >60)
GLUCOSE: 160 mg/dL — AB (ref 65–99)
Potassium: 3.2 mmol/L — ABNORMAL LOW (ref 3.5–5.1)
Sodium: 133 mmol/L — ABNORMAL LOW (ref 135–145)

## 2017-10-12 LAB — CBC
HCT: 38.2 % (ref 36.0–46.0)
HEMOGLOBIN: 13.3 g/dL (ref 12.0–15.0)
MCH: 31.4 pg (ref 26.0–34.0)
MCHC: 34.8 g/dL (ref 30.0–36.0)
MCV: 90.1 fL (ref 78.0–100.0)
Platelets: 312 10*3/uL (ref 150–400)
RBC: 4.24 MIL/uL (ref 3.87–5.11)
RDW: 12.8 % (ref 11.5–15.5)
WBC: 7.3 10*3/uL (ref 4.0–10.5)

## 2017-10-12 LAB — I-STAT CG4 LACTIC ACID, ED: Lactic Acid, Venous: 1.89 mmol/L (ref 0.5–1.9)

## 2017-10-12 LAB — I-STAT TROPONIN, ED: Troponin i, poc: 0 ng/mL (ref 0.00–0.08)

## 2017-10-12 MED ORDER — ONDANSETRON 4 MG PO TBDP
4.0000 mg | ORAL_TABLET | Freq: Once | ORAL | Status: AC | PRN
  Administered 2017-10-12: 4 mg via ORAL
  Filled 2017-10-12: qty 1

## 2017-10-12 NOTE — ED Triage Notes (Signed)
Pt to ED c/o sudden onset SOB x 1.5-2 hours with intermittent dizziness and nausea. Patient checks her BP at home and got a reading of 225/100. Patient is a heavy smoker. Denies CP, no extremity weakness or tingling. Ambulatory with steady gait. A&O x 4. Patient states "I just don't feel good." EDP at bedside to assess.

## 2017-10-12 NOTE — ED Notes (Signed)
EDP at bedside  

## 2017-10-13 ENCOUNTER — Emergency Department (HOSPITAL_COMMUNITY)
Admission: EM | Admit: 2017-10-13 | Discharge: 2017-10-13 | Disposition: A | Discharge status: Home / Self Care | Payer: Medicare HMO | Attending: Emergency Medicine | Admitting: Emergency Medicine

## 2017-10-13 DIAGNOSIS — R0602 Shortness of breath: Secondary | ICD-10-CM | POA: Diagnosis not present

## 2017-10-13 DIAGNOSIS — I160 Hypertensive urgency: Secondary | ICD-10-CM

## 2017-10-13 DIAGNOSIS — R69 Illness, unspecified: Secondary | ICD-10-CM | POA: Diagnosis not present

## 2017-10-13 DIAGNOSIS — Z7982 Long term (current) use of aspirin: Secondary | ICD-10-CM | POA: Diagnosis not present

## 2017-10-13 DIAGNOSIS — I10 Essential (primary) hypertension: Secondary | ICD-10-CM | POA: Diagnosis not present

## 2017-10-13 DIAGNOSIS — Z79899 Other long term (current) drug therapy: Secondary | ICD-10-CM | POA: Diagnosis not present

## 2017-10-13 LAB — INFLUENZA PANEL BY PCR (TYPE A & B)
Influenza A By PCR: NEGATIVE
Influenza B By PCR: NEGATIVE

## 2017-10-13 MED ORDER — ALBUTEROL SULFATE HFA 108 (90 BASE) MCG/ACT IN AERS
2.0000 | INHALATION_SPRAY | RESPIRATORY_TRACT | Status: DC | PRN
  Administered 2017-10-13: 2 via RESPIRATORY_TRACT
  Filled 2017-10-13: qty 6.7

## 2017-10-13 MED ORDER — ALBUTEROL SULFATE (2.5 MG/3ML) 0.083% IN NEBU
5.0000 mg | INHALATION_SOLUTION | Freq: Once | RESPIRATORY_TRACT | Status: AC
  Administered 2017-10-13: 5 mg via RESPIRATORY_TRACT
  Filled 2017-10-13: qty 6

## 2017-10-13 NOTE — ED Provider Notes (Signed)
[facility] EMERGENCY DEPARTMENT Provider Note   CSN: [id number] Arrival date & time: 10/12/17  2119     History   Chief Complaint Chief Complaint  Patient presents with  . Hypertension  . Shortness of Breath    HPI Alicia Dickson is a 77 y.o. female.  This patient is a 77 year old female with past medical history of hypertension, high cholesterol, and tobacco use.  She presents for evaluation of shortness of breath.  This is worsened over the past 2 days.  She denies any chest pain, fever, productive cough.  This evening her blood pressure was elevated and presents for evaluation of both of these issues.   The history is provided by the patient.  Hypertension  This is a new problem. The current episode started 2 days ago. The problem occurs constantly. The problem has been gradually worsening. Nothing aggravates the symptoms. Nothing relieves the symptoms. She has tried nothing for the symptoms.    Past Medical History:  Diagnosis Date  . Anxiety   . Cellulitis of right leg   . Cigarette smoker   . Diverticulosis of colon   . DJD (degenerative joint disease)   . GERD (gastroesophageal reflux disease)   . Glaucoma   . Hyperlipidemia   . Hypertension   . IBS (irritable bowel syndrome)   . Osteopenia     Patient Active Problem List   Diagnosis Date Noted  . Atopic eczema 12/19/2015  . Colitis, infectious 03/02/2015  . Colitis 03/02/2015  . Hypokalemia 03/02/2015  . Hyponatremia 03/02/2015  . Parotid mass 11/23/2013  . Eczematous dermatitis of eyelid 10/03/2013  . Cellulitis 03/22/2013  . Glaucoma 03/29/2009  . ANXIETY 03/04/2008  . DIVERTICULOSIS OF COLON 03/04/2008  . IRRITABLE BOWEL SYNDROME 03/04/2008  . DEGENERATIVE JOINT DISEASE 03/04/2008  . Disorder of bone and cartilage 03/04/2008  . CIGARETTE SMOKER 02/22/2008  . Hyperlipidemia 01/07/2008  . Essential hypertension 01/07/2008  . GERD 01/07/2008    Past Surgical History:    Procedure Laterality Date  . APPENDECTOMY     AGE 68  . COLONOSCOPY  07-02-10   per Dr. Henrene Pastor, severe diverticulosis, no polyps, repeat in 5 yrs   . TOTAL ABDOMINAL HYSTERECTOMY      OB History    Gravida Para Term Preterm AB Living   5 5       5    SAB TAB Ectopic Multiple Live Births                   Home Medications    Prior to Admission medications   Medication Sig Start Date End Date Taking? Authorizing Provider  aspirin 81 MG tablet Take 81 mg by mouth daily.    [provider]  brimonidine (ALPHAGAN) 0.15 % ophthalmic solution Place 1 drop into both eyes 2 (two) times daily.    [provider]  cetirizine (ZYRTEC) 10 MG tablet Take 10 mg by mouth daily.    [provider]  Cholecalciferol (VITAMIN D) 2000 UNITS CAPS Take 1 capsule by mouth daily.      [provider]  Cyanocobalamin (VITAMIN B-12 PO) Take by mouth daily.    [provider]  dorzolamide-timolol (COSOPT) 22.3-6.8 MG/ML ophthalmic solution Place 1 drop into both eyes 2 (two) times daily. 11/14/13   [provider]  halobetasol (ULTRAVATE) 0.05 % cream Apply topically 2 (two) times daily. 06/16/17   Laurey Morale, MD  hydrOXYzine (ATARAX/VISTARIL) 25 MG tablet TAKE 1 TABLET EVERY 4  HOURS AS NEEDED FOR ITCHING 07/12/17   Laurey Morale, MD  losartan-hydrochlorothiazide [facility]) 50-12.5 MG tablet Take 1 tablet by mouth daily. 06/16/17   Laurey Morale, MD  Multiple Vitamin (MULTIVITAMIN) tablet Take 1 tablet by mouth daily.      [provider]  rosuvastatin (CRESTOR) 10 MG tablet Take 1 tablet (10 mg total) by mouth daily. 06/16/17   Laurey Morale, MD    Family History Family History  Problem Relation Age of Onset  . Colon cancer Father   . Cancer Father        THROAT, COLON, ESOPHAGEAL  . Hypertension Mother   . Hyperlipidemia Mother   . Diabetes Son     Social History Social History   Tobacco Use  . Smoking status: Current Every Day  Smoker    Years: 20.00    Types: Cigarettes  . Smokeless tobacco: Never Used  . Tobacco comment: 4 CIGS A DAY  Substance Use Topics  . Alcohol use: Yes    Alcohol/week: 0.0 oz    Comment: social - wine  . Drug use: No     Allergies   Penicillins   Review of Systems Review of Systems  All other systems reviewed and are negative.    Physical Exam Updated Vital Signs BP (!) 160/85   Pulse 77   Temp 98.1 F (36.7 C) (Oral)   Resp 17   Ht 5' (1.524 m)   Wt 42.2 kg (93 lb)   SpO2 99%   BMI 18.16 kg/m   Physical Exam  Constitutional: She is oriented to person, place, and time. She appears well-developed and well-nourished. No distress.  HENT:  Head: Normocephalic and atraumatic.  Neck: Normal range of motion. Neck supple.  Cardiovascular: Normal rate and regular rhythm. Exam reveals no gallop and no friction rub.  No murmur heard. Pulmonary/Chest: Effort normal and breath sounds normal. No respiratory distress. She has no wheezes.  Abdominal: Soft. Bowel sounds are normal. She exhibits no distension. There is no tenderness.  Musculoskeletal: Normal range of motion.  Neurological: She is alert and oriented to person, place, and time.  Skin: Skin is warm and dry. She is not diaphoretic.  Nursing note and vitals reviewed.    ED Treatments / Results  Labs (all labs ordered are listed, but only abnormal results are displayed) Labs Reviewed  BASIC METABOLIC PANEL - Abnormal; Notable for the following components:      Result Value   Sodium 133 (*)    Potassium 3.2 (*)    Chloride 99 (*)    Glucose, Bld 160 (*)    All other components within normal limits  CBC  INFLUENZA PANEL BY PCR (TYPE A & B)  I-STAT TROPONIN, ED  I-STAT CG4 LACTIC ACID, ED    EKG  EKG Interpretation  Date/Time:  Tuesday October 12 2017 21:26:02 EST Ventricular Rate:  88 PR Interval:  142 QRS Duration: 82 QT Interval:  350 QTC Calculation: 423 R Axis:   103 Text Interpretation:   Normal sinus rhythm Possible Left atrial enlargement Rightward axis Nonspecific ST abnormality Abnormal ECG When compared with ECG of 11/04/2003, Rightward axis is now present Nonspecific ST abnormality is now present Confirmed by Delora Fuel ([id number]) on 10/12/2017 11:50:54 PM       Radiology Dg Chest 2 View  Result Date: 10/12/2017 CLINICAL DATA:  Acute onset shortness of breath for 2 hours, intermittent dizziness and nausea. Hypertensive. History of hypertension, smoker. EXAM: CHEST  2 VIEW  COMPARISON:  Chest radiograph May 25, 2013 FINDINGS: Cardiomediastinal silhouette is normal. Calcified aortic arch. No pleural effusions or focal consolidations. Similar to worsening chronic interstitial changes. Increased lung volumes with flattened hemidiaphragms. Similar RIGHT costophrenic angle pleural thickening/scarring. Trachea projects midline and there is no pneumothorax. Soft tissue planes and included osseous structures are non-suspicious. Osteopenia. IMPRESSION: COPD.  No acute cardiopulmonary process. Aortic Atherosclerosis (ICD10-I70.0). Electronically Signed   By: Elon Alas M.D.   On: 10/12/2017 22:05    Procedures Procedures (including critical care time)  Medications Ordered in ED Medications  albuterol (PROVENTIL) (2.5 MG/3ML) 0.083% nebulizer solution 5 mg (not administered)  ondansetron (ZOFRAN-ODT) disintegrating tablet 4 mg (4 mg Oral Given 10/12/17 2134)     Initial Impression / Assessment and Plan / ED Course  I have reviewed the triage vital signs and the nursing notes.  Pertinent labs & imaging results that were available during my care of the patient were reviewed by me and considered in my medical decision making (see chart for details).  Patient presents here with complaints of elevated blood pressure and shortness of breath.  She is feeling better after a nebulizer treatment and blood pressure has improved while in the ED.  Her chest x-ray shows what appears to  be COPD.  She has never been diagnosed with this, however as she is a cigarette smoker I suspect this is likely real.  I had planned to prescribe prednisone, however the patient does not want to take this.  She will be given an albuterol inhaler which she can use for her wheezing.  She was encouraged to stop smoking.  Final Clinical Impressions(s) / ED Diagnoses   Final diagnoses:  None    ED Discharge Orders    None       Veryl Speak, MD 10/13/17 [phone]

## 2017-10-13 NOTE — Discharge Instructions (Signed)
Albuterol MDI: 2 puffs every 4 hours as needed for wheezing or difficulty breathing.  Return to the emergency department if your symptoms significantly worsen or change.

## 2017-10-18 ENCOUNTER — Inpatient Hospital Stay: Payer: Medicare HMO | Admitting: Family Medicine

## 2017-10-19 ENCOUNTER — Ambulatory Visit (INDEPENDENT_AMBULATORY_CARE_PROVIDER_SITE_OTHER): Payer: Medicare HMO | Admitting: Family Medicine

## 2017-10-19 ENCOUNTER — Encounter: Payer: Self-pay | Admitting: Family Medicine

## 2017-10-19 VITALS — BP 140/70 | HR 71 | Temp 98.1°F | Wt 94.0 lb

## 2017-10-19 DIAGNOSIS — J439 Emphysema, unspecified: Secondary | ICD-10-CM | POA: Insufficient documentation

## 2017-10-19 DIAGNOSIS — I10 Essential (primary) hypertension: Secondary | ICD-10-CM | POA: Diagnosis not present

## 2017-10-19 DIAGNOSIS — E876 Hypokalemia: Secondary | ICD-10-CM

## 2017-10-19 MED ORDER — POTASSIUM CHLORIDE ER 10 MEQ PO TBCR: 10.0000 meq | EXTENDED_RELEASE_TABLET | Freq: Every day | ORAL | 3 refills | Status: DC

## 2017-10-19 NOTE — Progress Notes (Signed)
   Subjective:    Patient ID: Alicia Dickson, female    DOB: Dec 25, 1940, 77 y.o.   MRN: [id number]  HPI Here to follow up on an ER visit on 10-13-17 for SOB. Her BP has been fairly stable with systolic readings in the 572I or 140s. In the ER her CXR showed some hyperinflation consistent with COPD. She was counselled to stop smoking. She was given a nebulizer treatment which helped and she was sent home with a Proventil inhaler to use prn. She was felt well since then except for some generalized fatigue. Of note she takes a diuretic for HTN and her potassium that day was low at 3.2.    Review of Systems  Constitutional: Positive for fatigue.  Respiratory: Negative.   Cardiovascular: Negative.   Gastrointestinal: Negative.   Genitourinary: Negative.   Neurological: Negative.        Objective:   Physical Exam  Constitutional: She is oriented to person, place, and time. She appears well-developed and well-nourished. No distress.  Neck: No thyromegaly present.  Cardiovascular: Normal rate, regular rhythm, normal heart sounds and intact distal pulses.  Pulmonary/Chest: Effort normal and breath sounds normal. No respiratory distress. She has no wheezes. She has no rales.  Lymphadenopathy:    She has no cervical adenopathy.  Neurological: She is alert and oriented to person, place, and time.          Assessment & Plan:  Her HTN is well controlled. She has early COPD so I again urged her to quit smoking. She can use her  inhaler prn. We will start her on Klor-con 10 mEq daily and recheck a level in 4 weeks.  [name], MD

## 2017-10-29 ENCOUNTER — Other Ambulatory Visit: Payer: Self-pay | Admitting: *Deleted

## 2017-10-29 MED ORDER — LOSARTAN POTASSIUM-HCTZ 50-12.5 MG PO TABS: 1.0000 | ORAL_TABLET | Freq: Every day | ORAL | 1 refills | Status: DC

## 2017-10-29 NOTE — Telephone Encounter (Signed)
Rx done. 

## 2017-11-22 ENCOUNTER — Telehealth: Payer: Self-pay | Admitting: Family Medicine

## 2017-11-22 MED ORDER — ALBUTEROL SULFATE HFA 108 (90 BASE) MCG/ACT IN AERS: 2.0000 | INHALATION_SPRAY | Freq: Four times a day (QID) | RESPIRATORY_TRACT | 0 refills | Status: DC | PRN

## 2017-11-22 NOTE — Telephone Encounter (Signed)
Proventil refill request  Pt of Dr. Barbie Banner  Don't see this on her medication list.  [facility] [id number]  [place], [place]

## 2017-11-22 NOTE — Telephone Encounter (Signed)
Copied from [place] [id number]. Topic: Quick Communication - Rx Refill/Question >> Nov 22, 2017  9:42 AM Boyd Kerbs wrote:  Medication: Proventil inhaler ( prefers generic)  Has the patient contacted their pharmacy? No. (Agent: If no, request that the patient contact the pharmacy for the refill.)  Preferred Pharmacy (with phone number or street name):   [id number] - [place], [place]. AT [address] [place] [address] Phone: [phone] Fax: [phone]   Agent: Please be advised that RX refills may take up to 3 business days. We ask that you follow-up with your pharmacy.

## 2017-11-22 NOTE — Telephone Encounter (Signed)
Rx sent 

## 2017-11-23 DIAGNOSIS — H401112 Primary open-angle glaucoma, right eye, moderate stage: Secondary | ICD-10-CM | POA: Diagnosis not present

## 2017-12-15 ENCOUNTER — Other Ambulatory Visit: Payer: Self-pay | Admitting: Family Medicine

## 2017-12-29 DIAGNOSIS — L03115 Cellulitis of right lower limb: Secondary | ICD-10-CM | POA: Diagnosis not present

## 2017-12-29 DIAGNOSIS — L209 Atopic dermatitis, unspecified: Secondary | ICD-10-CM | POA: Diagnosis not present

## 2017-12-29 DIAGNOSIS — L299 Pruritus, unspecified: Secondary | ICD-10-CM | POA: Diagnosis not present

## 2018-01-07 DIAGNOSIS — L209 Atopic dermatitis, unspecified: Secondary | ICD-10-CM | POA: Diagnosis not present

## 2018-01-07 DIAGNOSIS — L299 Pruritus, unspecified: Secondary | ICD-10-CM | POA: Diagnosis not present

## 2018-02-01 DIAGNOSIS — L299 Pruritus, unspecified: Secondary | ICD-10-CM | POA: Diagnosis not present

## 2018-02-01 DIAGNOSIS — L2084 Intrinsic (allergic) eczema: Secondary | ICD-10-CM | POA: Diagnosis not present

## 2018-02-07 DIAGNOSIS — B351 Tinea unguium: Secondary | ICD-10-CM | POA: Diagnosis not present

## 2018-02-07 DIAGNOSIS — B353 Tinea pedis: Secondary | ICD-10-CM | POA: Diagnosis not present

## 2018-02-07 DIAGNOSIS — L602 Onychogryphosis: Secondary | ICD-10-CM | POA: Diagnosis not present

## 2018-02-16 DIAGNOSIS — B351 Tinea unguium: Secondary | ICD-10-CM | POA: Diagnosis not present

## 2018-03-10 DIAGNOSIS — M79674 Pain in right toe(s): Secondary | ICD-10-CM | POA: Diagnosis not present

## 2018-03-10 DIAGNOSIS — B351 Tinea unguium: Secondary | ICD-10-CM | POA: Diagnosis not present

## 2018-03-10 DIAGNOSIS — M79675 Pain in left toe(s): Secondary | ICD-10-CM | POA: Diagnosis not present

## 2018-03-21 DIAGNOSIS — L281 Prurigo nodularis: Secondary | ICD-10-CM | POA: Diagnosis not present

## 2018-03-21 DIAGNOSIS — L299 Pruritus, unspecified: Secondary | ICD-10-CM | POA: Diagnosis not present

## 2018-03-28 ENCOUNTER — Other Ambulatory Visit (HOSPITAL_COMMUNITY): Payer: Self-pay | Admitting: Podiatry

## 2018-03-28 DIAGNOSIS — R0989 Other specified symptoms and signs involving the circulatory and respiratory systems: Secondary | ICD-10-CM

## 2018-03-30 ENCOUNTER — Ambulatory Visit (HOSPITAL_COMMUNITY)
Admission: RE | Admit: 2018-03-30 | Discharge: 2018-03-30 | Disposition: A | Discharge status: Home / Self Care | Payer: Medicare HMO | Source: Ambulatory Visit | Attending: Cardiovascular Disease | Admitting: Cardiovascular Disease

## 2018-03-30 DIAGNOSIS — R0989 Other specified symptoms and signs involving the circulatory and respiratory systems: Secondary | ICD-10-CM | POA: Insufficient documentation

## 2018-04-05 DIAGNOSIS — H401121 Primary open-angle glaucoma, left eye, mild stage: Secondary | ICD-10-CM | POA: Diagnosis not present

## 2018-05-19 ENCOUNTER — Encounter (HOSPITAL_COMMUNITY): Payer: Self-pay | Admitting: Emergency Medicine

## 2018-05-19 ENCOUNTER — Other Ambulatory Visit: Payer: Self-pay

## 2018-05-19 ENCOUNTER — Emergency Department (HOSPITAL_COMMUNITY): Payer: Medicare HMO

## 2018-05-19 ENCOUNTER — Observation Stay (HOSPITAL_COMMUNITY)
Admission: EM | Admit: 2018-05-19 | Discharge: 2018-05-20 | Disposition: A | Discharge status: Home / Self Care | Payer: Medicare HMO | Attending: Neurosurgery | Admitting: Neurosurgery

## 2018-05-19 ENCOUNTER — Ambulatory Visit: Payer: Self-pay | Admitting: Family Medicine

## 2018-05-19 DIAGNOSIS — H409 Unspecified glaucoma: Secondary | ICD-10-CM | POA: Insufficient documentation

## 2018-05-19 DIAGNOSIS — S065X0A Traumatic subdural hemorrhage without loss of consciousness, initial encounter: Principal | ICD-10-CM | POA: Insufficient documentation

## 2018-05-19 DIAGNOSIS — J449 Chronic obstructive pulmonary disease, unspecified: Secondary | ICD-10-CM | POA: Insufficient documentation

## 2018-05-19 DIAGNOSIS — S0003XA Contusion of scalp, initial encounter: Secondary | ICD-10-CM | POA: Diagnosis not present

## 2018-05-19 DIAGNOSIS — Z7982 Long term (current) use of aspirin: Secondary | ICD-10-CM | POA: Diagnosis not present

## 2018-05-19 DIAGNOSIS — S065X9A Traumatic subdural hemorrhage with loss of consciousness of unspecified duration, initial encounter: Secondary | ICD-10-CM | POA: Diagnosis present

## 2018-05-19 DIAGNOSIS — R42 Dizziness and giddiness: Secondary | ICD-10-CM | POA: Diagnosis present

## 2018-05-19 DIAGNOSIS — S065XAA Traumatic subdural hemorrhage with loss of consciousness status unknown, initial encounter: Secondary | ICD-10-CM | POA: Diagnosis present

## 2018-05-19 DIAGNOSIS — E785 Hyperlipidemia, unspecified: Secondary | ICD-10-CM | POA: Diagnosis not present

## 2018-05-19 DIAGNOSIS — W1800XA Striking against unspecified object with subsequent fall, initial encounter: Secondary | ICD-10-CM | POA: Insufficient documentation

## 2018-05-19 DIAGNOSIS — I10 Essential (primary) hypertension: Secondary | ICD-10-CM | POA: Diagnosis not present

## 2018-05-19 DIAGNOSIS — Z79899 Other long term (current) drug therapy: Secondary | ICD-10-CM | POA: Diagnosis not present

## 2018-05-19 DIAGNOSIS — S199XXA Unspecified injury of neck, initial encounter: Secondary | ICD-10-CM | POA: Diagnosis not present

## 2018-05-19 DIAGNOSIS — F17210 Nicotine dependence, cigarettes, uncomplicated: Secondary | ICD-10-CM | POA: Diagnosis not present

## 2018-05-19 DIAGNOSIS — R69 Illness, unspecified: Secondary | ICD-10-CM | POA: Diagnosis not present

## 2018-05-19 LAB — CBC
HEMATOCRIT: 39 % (ref 36.0–46.0)
HEMOGLOBIN: 13.9 g/dL (ref 12.0–15.0)
MCH: 31.4 pg (ref 26.0–34.0)
MCHC: 35.6 g/dL (ref 30.0–36.0)
MCV: 88 fL (ref 78.0–100.0)
PLATELETS: 313 10*3/uL (ref 150–400)
RBC: 4.43 MIL/uL (ref 3.87–5.11)
RDW: 12.2 % (ref 11.5–15.5)
WBC: 8 10*3/uL (ref 4.0–10.5)

## 2018-05-19 LAB — CBG MONITORING, ED: Glucose-Capillary: 123 mg/dL — ABNORMAL HIGH (ref 70–99)

## 2018-05-19 LAB — BASIC METABOLIC PANEL
Anion gap: 10 (ref 5–15)
BUN: 13 mg/dL (ref 8–23)
CHLORIDE: 96 mmol/L — AB (ref 98–111)
CO2: 25 mmol/L (ref 22–32)
Calcium: 9.2 mg/dL (ref 8.9–10.3)
Creatinine, Ser: 0.49 mg/dL (ref 0.44–1.00)
GFR calc Af Amer: >60 mL/min (ref >60)
GFR calc non Af Amer: >60 mL/min (ref >60)
Glucose, Bld: 116 mg/dL — ABNORMAL HIGH (ref 70–99)
POTASSIUM: 3 mmol/L — AB (ref 3.5–5.1)
SODIUM: 131 mmol/L — AB (ref 135–145)

## 2018-05-19 LAB — MRSA PCR SCREENING: MRSA BY PCR: NEGATIVE

## 2018-05-19 MED ORDER — LEVETIRACETAM 500 MG PO TABS
500.0000 mg | ORAL_TABLET | Freq: Two times a day (BID) | ORAL | Status: DC
  Administered 2018-05-19 – 2018-05-20 (×2): 500 mg via ORAL
  Filled 2018-05-19 (×2): qty 1

## 2018-05-19 MED ORDER — HYDRALAZINE HCL 20 MG/ML IJ SOLN
5.0000 mg | INTRAMUSCULAR | Status: DC | PRN
  Administered 2018-05-19: 5 mg via INTRAVENOUS
  Filled 2018-05-19 (×2): qty 1

## 2018-05-19 MED ORDER — LOSARTAN POTASSIUM 50 MG PO TABS
50.0000 mg | ORAL_TABLET | Freq: Every day | ORAL | Status: DC
  Administered 2018-05-20: 50 mg via ORAL
  Filled 2018-05-19: qty 1

## 2018-05-19 MED ORDER — SODIUM CHLORIDE 0.9 % IV SOLN: INTRAVENOUS | Status: DC

## 2018-05-19 MED ORDER — SODIUM CHLORIDE 0.9% FLUSH
3.0000 mL | Freq: Two times a day (BID) | INTRAVENOUS | Status: DC
  Administered 2018-05-19: 3 mL via INTRAVENOUS

## 2018-05-19 MED ORDER — FLEET ENEMA 7-19 GM/118ML RE ENEM: 1.0000 | ENEMA | Freq: Once | RECTAL | Status: DC | PRN

## 2018-05-19 MED ORDER — SODIUM CHLORIDE 0.9% FLUSH: 3.0000 mL | INTRAVENOUS | Status: DC | PRN

## 2018-05-19 MED ORDER — ADULT MULTIVITAMIN W/MINERALS CH
1.0000 | ORAL_TABLET | Freq: Every day | ORAL | Status: DC
  Administered 2018-05-19 – 2018-05-20 (×2): 1 via ORAL
  Filled 2018-05-19 (×4): qty 1

## 2018-05-19 MED ORDER — ROSUVASTATIN CALCIUM 10 MG PO TABS
10.0000 mg | ORAL_TABLET | Freq: Every day | ORAL | Status: DC
  Administered 2018-05-19 – 2018-05-20 (×2): 10 mg via ORAL
  Filled 2018-05-19 (×2): qty 1

## 2018-05-19 MED ORDER — ALBUTEROL SULFATE (2.5 MG/3ML) 0.083% IN NEBU: 2.5000 mg | INHALATION_SOLUTION | Freq: Four times a day (QID) | RESPIRATORY_TRACT | Status: DC | PRN

## 2018-05-19 MED ORDER — ONDANSETRON HCL 4 MG/2ML IJ SOLN
4.0000 mg | Freq: Four times a day (QID) | INTRAMUSCULAR | Status: DC | PRN
  Administered 2018-05-19: 4 mg via INTRAVENOUS

## 2018-05-19 MED ORDER — LATANOPROST 0.005 % OP SOLN
1.0000 [drp] | Freq: Every day | OPHTHALMIC | Status: DC
  Administered 2018-05-19: 1 [drp] via OPHTHALMIC
  Filled 2018-05-19: qty 2.5

## 2018-05-19 MED ORDER — ZOLPIDEM TARTRATE 5 MG PO TABS: 5.0000 mg | ORAL_TABLET | Freq: Every evening | ORAL | Status: DC | PRN

## 2018-05-19 MED ORDER — ONDANSETRON HCL 4 MG PO TABS: 4.0000 mg | ORAL_TABLET | Freq: Four times a day (QID) | ORAL | Status: DC | PRN

## 2018-05-19 MED ORDER — ACETAMINOPHEN 325 MG PO TABS: 650.0000 mg | ORAL_TABLET | Freq: Four times a day (QID) | ORAL | Status: DC | PRN

## 2018-05-19 MED ORDER — SODIUM CHLORIDE 0.9 % IV SOLN
INTRAVENOUS | Status: DC
  Administered 2018-05-19: 16:00:00 via INTRAVENOUS

## 2018-05-19 MED ORDER — SODIUM CHLORIDE 0.9 % IV SOLN: 250.0000 mL | INTRAVENOUS | Status: DC | PRN

## 2018-05-19 MED ORDER — ONDANSETRON HCL 4 MG/2ML IJ SOLN
INTRAMUSCULAR | Status: AC
  Administered 2018-05-19: 13:00:00
  Filled 2018-05-19: qty 2

## 2018-05-19 MED ORDER — DORZOLAMIDE HCL-TIMOLOL MAL 2-0.5 % OP SOLN
1.0000 [drp] | Freq: Two times a day (BID) | OPHTHALMIC | Status: DC
  Administered 2018-05-19 – 2018-05-20 (×2): 1 [drp] via OPHTHALMIC
  Filled 2018-05-19: qty 10

## 2018-05-19 MED ORDER — VITAMIN D 1000 UNITS PO TABS
2000.0000 [IU] | ORAL_TABLET | Freq: Every day | ORAL | Status: DC
  Administered 2018-05-20: 2000 [IU] via ORAL
  Filled 2018-05-19: qty 2

## 2018-05-19 MED ORDER — ACETAMINOPHEN 650 MG RE SUPP: 650.0000 mg | Freq: Four times a day (QID) | RECTAL | Status: DC | PRN

## 2018-05-19 MED ORDER — HYDROCHLOROTHIAZIDE 12.5 MG PO CAPS
12.5000 mg | ORAL_CAPSULE | Freq: Every day | ORAL | Status: DC
  Administered 2018-05-20: 12.5 mg via ORAL
  Filled 2018-05-19: qty 1

## 2018-05-19 MED ORDER — HYDROXYZINE HCL 25 MG PO TABS: 25.0000 mg | ORAL_TABLET | ORAL | Status: DC | PRN

## 2018-05-19 MED ORDER — HYDROCODONE-ACETAMINOPHEN 5-325 MG PO TABS: 1.0000 | ORAL_TABLET | ORAL | Status: DC | PRN

## 2018-05-19 MED ORDER — LOSARTAN POTASSIUM-HCTZ 50-12.5 MG PO TABS: 1.0000 | ORAL_TABLET | Freq: Every day | ORAL | Status: DC

## 2018-05-19 MED ORDER — PROMETHAZINE HCL 25 MG/ML IJ SOLN
12.5000 mg | Freq: Four times a day (QID) | INTRAMUSCULAR | Status: DC | PRN
  Administered 2018-05-19: 12.5 mg via INTRAVENOUS
  Filled 2018-05-19: qty 1

## 2018-05-19 MED ORDER — TRIAMCINOLONE ACETONIDE 0.1 % EX CREA
1.0000 "application " | TOPICAL_CREAM | Freq: Two times a day (BID) | CUTANEOUS | Status: DC
  Filled 2018-05-19: qty 15

## 2018-05-19 MED ORDER — POTASSIUM CHLORIDE ER 10 MEQ PO TBCR
10.0000 meq | EXTENDED_RELEASE_TABLET | Freq: Every day | ORAL | Status: DC
  Administered 2018-05-20: 10 meq via ORAL
  Filled 2018-05-19 (×3): qty 1

## 2018-05-19 NOTE — ED Notes (Signed)
Purwick placed on pt

## 2018-05-19 NOTE — H&P (Signed)
Chief Complaint   Chief Complaint  Patient presents with  . Fall  . Dizziness  . Emesis    HPI   HPI: Alicia Dickson is a 77 y.o. female who presented to [facility] ER for persistent dizziness and nausea after fall at home yesterday. CT head ordered and significant for small SDH. NSY called for monitoring and admitting.  Yesterday she was picking up newspaper when she fell. Unclear LOC. Has since been having dizziness, nausea and several bouts of vomiting. Currently denies headache but does complain of nausea. Given zofran with minimal to no relief. She denies focal deficits. Is on ASA daily for preventative purposes. This has been d/c. No history of MI or CVA.   Patient Active Problem List   Diagnosis Date Noted  . Subdural hematoma ([place]) 05/19/2018  . COPD (chronic obstructive pulmonary disease) with emphysema ([place]) 10/19/2017  . Atopic eczema 12/19/2015  . Colitis, infectious 03/02/2015  . Colitis 03/02/2015  . Hypokalemia 03/02/2015  . Hyponatremia 03/02/2015  . Parotid mass 11/23/2013  . Eczematous dermatitis of eyelid 10/03/2013  . Cellulitis 03/22/2013  . Glaucoma 03/29/2009  . ANXIETY 03/04/2008  . DIVERTICULOSIS OF COLON 03/04/2008  . IRRITABLE BOWEL SYNDROME 03/04/2008  . DEGENERATIVE JOINT DISEASE 03/04/2008  . Disorder of bone and cartilage 03/04/2008  . CIGARETTE SMOKER 02/22/2008  . Hyperlipidemia 01/07/2008  . Essential hypertension 01/07/2008  . GERD 01/07/2008    PMH: Past Medical History:  Diagnosis Date  . Anxiety   . Cellulitis of right leg   . Cigarette smoker   . Diverticulosis of colon   . DJD (degenerative joint disease)   . GERD (gastroesophageal reflux disease)   . Glaucoma   . Hyperlipidemia   . Hypertension   . IBS (irritable bowel syndrome)   . Osteopenia     PSH: Past Surgical History:  Procedure Laterality Date  . APPENDECTOMY     AGE 53  . COLONOSCOPY  07-02-10   per Dr. Henrene Pastor, severe diverticulosis, no polyps, repeat in 5 yrs   .  TOTAL ABDOMINAL HYSTERECTOMY       (Not in a hospital admission)  SH: Social History   Tobacco Use  . Smoking status: Current Every Day Smoker    Years: 20.00    Types: Cigarettes  . Smokeless tobacco: Never Used  Substance Use Topics  . Alcohol use: Yes    Alcohol/week: 0.0 standard drinks    Comment: social - wine  . Drug use: No    MEDS: Prior to Admission medications   Medication Sig Start Date End Date Taking? Authorizing Provider  ALPHAGAN P 0.1 % SOLN Place 1 drop into both eyes 2 (two) times daily. 10/08/17  Yes [provider]  aspirin 81 MG tablet Take 81 mg by mouth daily.   Yes [provider]  cetirizine (ZYRTEC) 10 MG tablet Take 10 mg by mouth daily.   Yes [provider]  Cholecalciferol (VITAMIN D) 2000 UNITS CAPS Take 2,000 Units by mouth daily.    Yes [provider]  Cyanocobalamin (VITAMIN B-12 PO) Take 1 tablet by mouth daily.    Yes [provider]  dorzolamide-timolol (COSOPT) 22.3-6.8 MG/ML ophthalmic solution Place 1 drop into both eyes 2 (two) times daily. 11/14/13  Yes [provider]  hydrOXYzine (ATARAX/VISTARIL) 25 MG tablet TAKE 1 TABLET EVERY 4 HOURS AS NEEDED FOR ITCHING Patient taking differently: Take 25 mg by mouth every 4 (four) hours as needed for itching.  07/12/17  Yes [name]  A, MD  latanoprost (XALATAN) 0.005 % ophthalmic solution Place 1 drop into both eyes at bedtime. 10/04/17  Yes [provider]  losartan-hydrochlorothiazide (HYZAAR) 50-12.5 MG tablet Take 1 tablet by mouth daily. 10/29/17  Yes Laurey Morale, MD  Multiple Vitamin (MULTIVITAMIN) tablet Take 1 tablet by mouth daily.     Yes [provider]  potassium chloride (KLOR-CON 10) 10 MEQ tablet Take 1 tablet (10 mEq total) by mouth daily. 10/19/17  Yes Laurey Morale, MD  rosuvastatin (CRESTOR) 10 MG tablet Take 1 tablet (10 mg total) by mouth daily. 06/16/17  Yes Laurey Morale, MD  triamcinolone cream  (KENALOG) 0.1 % Apply 1 application topically 2 (two) times daily. Never apply to face or groin 05/03/18  Yes [provider]  VENTOLIN HFA 108 (90 Base) MCG/ACT inhaler INHALE 2 PUFFS BY MOUTH EVERY 6 HOURS AS NEEDED FOR WHEEZE OR SHORTNESS OF BREATH Patient taking differently: Inhale 1-2 puffs into the lungs every 6 (six) hours as needed for wheezing or shortness of breath.  12/15/17  Yes Laurey Morale, MD  halobetasol (ULTRAVATE) 0.05 % cream Apply topically 2 (two) times daily. Patient not taking: Reported on 05/19/2018 06/16/17   Laurey Morale, MD    ALLERGY: Allergies  Allergen Reactions  . Penicillins Rash    Has patient had a PCN reaction causing immediate rash, facial/tongue/throat swelling, SOB or lightheadedness with hypotension: Yes Has patient had a PCN reaction causing severe rash involving mucus membranes or skin necrosis: No Has patient had a PCN reaction that required hospitalization: No Has patient had a PCN reaction occurring within the last 10 years: No If all of the above answers are "NO", then may proceed with Cephalosporin use.     Social History   Tobacco Use  . Smoking status: Current Every Day Smoker    Years: 20.00    Types: Cigarettes  . Smokeless tobacco: Never Used  Substance Use Topics  . Alcohol use: Yes    Alcohol/week: 0.0 standard drinks    Comment: social - wine     Family History  Problem Relation Age of Onset  . Colon cancer Father   . Cancer Father        THROAT, COLON, ESOPHAGEAL  . Hypertension Mother   . Hyperlipidemia Mother   . Diabetes Son      ROS   Review of Systems  Constitutional: Negative.   Eyes: Positive for photophobia. Negative for blurred vision and double vision.  Cardiovascular: Negative.   Gastrointestinal: Positive for nausea and vomiting.  Genitourinary: Negative.   Musculoskeletal: Negative.   Skin: Negative.   Neurological: Positive for dizziness. Negative for tingling, tremors, sensory change,  speech change, focal weakness, seizures, loss of consciousness, weakness and headaches.    Exam   Vitals:   05/19/18 1100 05/19/18 1137  BP: (!) 197/91 (!) 184/81  Pulse:  68  Resp: 16 15  Temp:    SpO2: 99% 99%   General appearance: elderly female, resting comfortably Eyes: PERRL, Fundoscopic: normal Cardiovascular: Regular rate and rhythm without murmurs, rubs, gallops. No edema or variciosities. Distal pulses normal. Pulmonary: Clear to auscultation Musculoskeletal:     Muscle tone upper extremities: Normal    Muscle tone lower extremities: Normal    Motor exam: Upper Extremities Deltoid Bicep Tricep Grip  Right 5/5 5/5 5/5 5/5  Left 5/5 5/5 5/5 5/5   Lower Extremity IP Quad PF DF EHL  Right 5/5 5/5 5/5 5/5 5/5  Left 5/5  5/5 5/5 5/5 5/5   Neurological Awake, alert, oriented Memory and concentration grossly intact Speech fluent, appropriate CNII: Visual fields normal CNIII/IV/VI: EOMI CNV: Facial sensation normal CNVII: Symmetric, normal strength CNVIII: Grossly normal CNIX: Normal palate movement CNXI: Trap and SCM strength normal CN XII: Tongue protrusion normal Sensation grossly intact to LT DTR: Normal Coordination (finger/nose & heel/shin): Normal  Results - Imaging/Labs   Results for orders placed or performed during the hospital encounter of 05/19/18 (from the past 48 hour(s))  CBG monitoring, ED     Status: Abnormal   Collection Time: 05/19/18  9:58 AM  Result Value Ref Range   Glucose-Capillary 123 (H) 70 - 99 mg/dL  Basic metabolic panel     Status: Abnormal   Collection Time: 05/19/18 10:45 AM  Result Value Ref Range   Sodium 131 (L) 135 - 145 mmol/L   Potassium 3.0 (L) 3.5 - 5.1 mmol/L   Chloride 96 (L) 98 - 111 mmol/L   CO2 25 22 - 32 mmol/L   Glucose, Bld 116 (H) 70 - 99 mg/dL   BUN 13 8 - 23 mg/dL   Creatinine, Ser 0.49 0.44 - 1.00 mg/dL   Calcium 9.2 8.9 - 10.3 mg/dL   GFR calc non Af Amer >60 >60 mL/min   GFR calc Af Amer >60 >60  mL/min    Comment: (NOTE) The eGFR has been calculated using the CKD EPI equation. This calculation has not been validated in all clinical situations. eGFR's persistently <60 mL/min signify possible Chronic Kidney Disease.    Anion gap 10 5 - 15    Comment: Performed at [facility], [place] [address], [place], [place] [address]  CBC     Status: None   Collection Time: 05/19/18 10:45 AM  Result Value Ref Range   WBC 8.0 4.0 - 10.5 K/uL   RBC 4.43 3.87 - 5.11 MIL/uL   Hemoglobin 13.9 12.0 - 15.0 g/dL   HCT 39.0 36.0 - 46.0 %   MCV 88.0 78.0 - 100.0 fL   MCH 31.4 26.0 - 34.0 pg   MCHC 35.6 30.0 - 36.0 g/dL   RDW 12.2 11.5 - 15.5 %   Platelets 313 150 - 400 K/uL    Comment: Performed at [facility], [place] [address], [place], [place] [address]    Dg Chest 2 View  Result Date: 05/19/2018 CLINICAL DATA:  The patient was dizzy yesterday morning and subsequently fell. Persistent dizziness and nausea. Current smoker, history of hypertension. EXAM: CHEST - 2 VIEW COMPARISON:  PA and lateral chest x-ray of October 12, 2017 FINDINGS: The lungs are mildly hyperinflated. The interstitial markings are prominent though stable. The heart and pulmonary vascularity are normal. The mediastinum is normal in width. There is calcification in the wall of the aortic arch. IMPRESSION: COPD. No pneumonia, CHF, nor other acute cardiopulmonary abnormality. Thoracic aortic atherosclerosis. Electronically Signed   By: David  Martinique M.D.   On: 05/19/2018 11:16   Ct Head Wo Contrast  Result Date: 05/19/2018 CLINICAL DATA:  77 year old female status post fall yesterday. Subsequent headache, dizziness, nausea vomiting. EXAM: CT HEAD WITHOUT CONTRAST CT CERVICAL SPINE WITHOUT CONTRAST TECHNIQUE: Multidetector CT imaging of the head and cervical spine was performed following the standard protocol without intravenous contrast. Multiplanar CT image reconstructions of the cervical spine  were also generated. COMPARISON:  Neck CT 02/05/2014. Cervical spine MRI 01/24/2011. FINDINGS: CT HEAD FINDINGS Brain: Broad-based mixed density but mostly hyperdense right side subdural hematoma (coronal image  39) measuring up to 7 millimeters in thickness. Trace leftward midline shift. No other significant intracranial mass effect. Trace subdural layering on the right tentorium. No other intracranial hemorrhage identified. No ventriculomegaly. No cortically based acute infarct identified. Patchy mild for age nonspecific cerebral white matter hypodensity. Left basal ganglia dilated perivascular space (normal variant). Vascular: Calcified atherosclerosis at the skull base. Skull: No fracture identified. Sinuses/Orbits: Visualized paranasal sinuses and mastoids are stable and well pneumatized. Other: Broad-based right posterior scalp hematoma up to 9 millimeters in thickness. Underlying calvarium intact. No scalp soft tissue gas. Visualized orbit soft tissues are within normal limits. CT CERVICAL SPINE FINDINGS Alignment: Reversal of cervical lordosis, increased from prior studies. Degenerative appearing chronic anterolisthesis of C4 on C5 has also mildly progressed. New degenerative appearing mild anterolisthesis of C3 on C4, also with associated chronic facet degeneration. Bilateral posterior element alignment is within normal limits. Cervicothoracic junction alignment is within normal limits. Skull base and vertebrae: Visualized skull base is intact. No atlanto-occipital dissociation. No cervical spine fracture identified. Soft tissues and spinal canal: No prevertebral fluid or swelling. No visible canal hematoma. Negative noncontrast neck soft tissues. Disc levels: Degenerative appearing anterolisthesis of C3 on C4 and C4 on C5 with multilevel left side upper cervical facet arthropathy. Chronic but progressed C5-C6 and C6-C7 disc degeneration and degenerative vertebral body sclerosis since 2015. Up to mild  degenerative spinal stenosis, primarily at C5-C6. Upper chest: Visible upper thoracic levels appear stable and intact. Negative lung apices. Negative noncontrast thoracic inlet. Other: Absent dentition . IMPRESSION: 1. Right scalp hematoma associated with broad based right side subdural hematoma measuring up to 7 mm in thickness. No associated skull fracture identified. 2. Minimal intracranial mass effect with trace leftward midline shift. 3. No other acute traumatic injury identified in the head or cervical spine. 4. Progressed since 2015 degenerative appearing cervical spine spondylolisthesis at C3-C4 and C4-C5. Similarly progressed disc and vertebral body degeneration at C5-C6 and C6-C7. Electronically Signed   By: Genevie Ann M.D.   On: 05/19/2018 11:49   Ct Cervical Spine Wo Contrast  Result Date: 05/19/2018 CLINICAL DATA:  77 year old female status post fall yesterday. Subsequent headache, dizziness, nausea vomiting. EXAM: CT HEAD WITHOUT CONTRAST CT CERVICAL SPINE WITHOUT CONTRAST TECHNIQUE: Multidetector CT imaging of the head and cervical spine was performed following the standard protocol without intravenous contrast. Multiplanar CT image reconstructions of the cervical spine were also generated. COMPARISON:  Neck CT 02/05/2014. Cervical spine MRI 01/24/2011. FINDINGS: CT HEAD FINDINGS Brain: Broad-based mixed density but mostly hyperdense right side subdural hematoma (coronal image 39) measuring up to 7 millimeters in thickness. Trace leftward midline shift. No other significant intracranial mass effect. Trace subdural layering on the right tentorium. No other intracranial hemorrhage identified. No ventriculomegaly. No cortically based acute infarct identified. Patchy mild for age nonspecific cerebral white matter hypodensity. Left basal ganglia dilated perivascular space (normal variant). Vascular: Calcified atherosclerosis at the skull base. Skull: No fracture identified. Sinuses/Orbits: Visualized  paranasal sinuses and mastoids are stable and well pneumatized. Other: Broad-based right posterior scalp hematoma up to 9 millimeters in thickness. Underlying calvarium intact. No scalp soft tissue gas. Visualized orbit soft tissues are within normal limits. CT CERVICAL SPINE FINDINGS Alignment: Reversal of cervical lordosis, increased from prior studies. Degenerative appearing chronic anterolisthesis of C4 on C5 has also mildly progressed. New degenerative appearing mild anterolisthesis of C3 on C4, also with associated chronic facet degeneration. Bilateral posterior element alignment is within normal limits. Cervicothoracic junction alignment is within normal  limits. Skull base and vertebrae: Visualized skull base is intact. No atlanto-occipital dissociation. No cervical spine fracture identified. Soft tissues and spinal canal: No prevertebral fluid or swelling. No visible canal hematoma. Negative noncontrast neck soft tissues. Disc levels: Degenerative appearing anterolisthesis of C3 on C4 and C4 on C5 with multilevel left side upper cervical facet arthropathy. Chronic but progressed C5-C6 and C6-C7 disc degeneration and degenerative vertebral body sclerosis since 2015. Up to mild degenerative spinal stenosis, primarily at C5-C6. Upper chest: Visible upper thoracic levels appear stable and intact. Negative lung apices. Negative noncontrast thoracic inlet. Other: Absent dentition . IMPRESSION: 1. Right scalp hematoma associated with broad based right side subdural hematoma measuring up to 7 mm in thickness. No associated skull fracture identified. 2. Minimal intracranial mass effect with trace leftward midline shift. 3. No other acute traumatic injury identified in the head or cervical spine. 4. Progressed since 2015 degenerative appearing cervical spine spondylolisthesis at C3-C4 and C4-C5. Similarly progressed disc and vertebral body degeneration at C5-C6 and C6-C7. Electronically Signed   By: Genevie Ann M.D.   On:  05/19/2018 11:49     Impression/Plan   77 y.o. female with small acute right SDH, no mass effect, no MLS, after a fall yesterday. She is neurologically intact although does have post concussive symptoms. No acute NS intervention indicated - Admit for observation - Monitor neuro exam q 2 hours. Report any changes - Repeat head CT tomorrow, sooner as indicated by neuro exam - Keppra 590m BID x7days for seizure prophylaxis - BP goal <160; prn hydralazine

## 2018-05-19 NOTE — ED Notes (Signed)
ED TO INPATIENT HANDOFF REPORT  Name/Age/Gender Alicia Dickson 77 y.o. female  Code Status    Code Status Orders  (From admission, onward)         Start     Ordered   05/19/18 1220  Full code  Continuous     05/19/18 1220        Code Status History    Date Active Date Inactive Code Status Order ID Comments User Context   03/02/2015 1811 03/04/2015 1916 DNR [id number]  Annita Brod, MD Inpatient    Advance Directive Documentation     Most Recent Value  Type of Advance Directive  Out of facility DNR (pink MOST or yellow form), Healthcare Power of Attorney  Pre-existing out of facility DNR order (yellow form or pink MOST form)  Yellow form placed in chart (order not valid for inpatient use)  "MOST" Form in Place?  -      Home/SNF/Other Home  Chief Complaint nausea,hell hit head  Level of Care/Admitting Diagnosis ED Disposition    ED Disposition Condition [place]: [place] [100100]  Level of Care: Stepdown [14]  Diagnosis: Subdural hematoma [facility]) [[id number]]  Admitting Physician: Consuella Lose [[id number]]  Attending Physician: Consuella Lose [[id number]]  Bed request comments: 4NP  PT Class (Do Not Modify): Observation [104]  PT Acc Code (Do Not Modify): Observation [10022]       Medical History Past Medical History:  Diagnosis Date  . Anxiety   . Cellulitis of right leg   . Cigarette smoker   . Diverticulosis of colon   . DJD (degenerative joint disease)   . GERD (gastroesophageal reflux disease)   . Glaucoma   . Hyperlipidemia   . Hypertension   . IBS (irritable bowel syndrome)   . Osteopenia     Allergies Allergies  Allergen Reactions  . Penicillins Rash    Has patient had a PCN reaction causing immediate rash, facial/tongue/throat swelling, SOB or lightheadedness with hypotension: Yes Has patient had a PCN reaction causing severe rash involving mucus membranes or skin necrosis: No Has patient had  a PCN reaction that required hospitalization: No Has patient had a PCN reaction occurring within the last 10 years: No If all of the above answers are "NO", then may proceed with Cephalosporin use.     IV Location/Drains/Wounds Patient Lines/Drains/Airways Status   Active Line/Drains/Airways    Name:   Placement date:   Placement time:   Site:   Days:   Peripheral IV 05/19/18 Left Wrist   05/19/18    1100    Wrist   less than 1          Labs/Imaging Results for orders placed or performed during the hospital encounter of 05/19/18 (from the past 48 hour(s))  CBG monitoring, ED     Status: Abnormal   Collection Time: 05/19/18  9:58 AM  Result Value Ref Range   Glucose-Capillary 123 (H) 70 - 99 mg/dL  Basic metabolic panel     Status: Abnormal   Collection Time: 05/19/18 10:45 AM  Result Value Ref Range   Sodium 131 (L) 135 - 145 mmol/L   Potassium 3.0 (L) 3.5 - 5.1 mmol/L   Chloride 96 (L) 98 - 111 mmol/L   CO2 25 22 - 32 mmol/L   Glucose, Bld 116 (H) 70 - 99 mg/dL   BUN 13 8 - 23 mg/dL   Creatinine, Ser 0.49 0.44 - 1.00 mg/dL   Calcium  9.2 8.9 - 10.3 mg/dL   GFR calc non Af Amer >60 >60 mL/min   GFR calc Af Amer >60 >60 mL/min    Comment: (NOTE) The eGFR has been calculated using the CKD EPI equation. This calculation has not been validated in all clinical situations. eGFR's persistently <60 mL/min signify possible Chronic Kidney Disease.    Anion gap 10 5 - 15    Comment: Performed at [facility], [place] [address] [place], [place] [address]  CBC     Status: None   Collection Time: 05/19/18 10:45 AM  Result Value Ref Range   WBC 8.0 4.0 - 10.5 K/uL   RBC 4.43 3.87 - 5.11 MIL/uL   Hemoglobin 13.9 12.0 - 15.0 g/dL   HCT 39.0 36.0 - 46.0 %   MCV 88.0 78.0 - 100.0 fL   MCH 31.4 26.0 - 34.0 pg   MCHC 35.6 30.0 - 36.0 g/dL   RDW 12.2 11.5 - 15.5 %   Platelets 313 150 - 400 K/uL    Comment: Performed at [facility], [place] [address] [place], [place] [address]   Dg Chest 2 View  Result Date: 05/19/2018 CLINICAL DATA:  The patient was dizzy yesterday morning and subsequently fell. Persistent dizziness and nausea. Current smoker, history of hypertension. EXAM: CHEST - 2 VIEW COMPARISON:  PA and lateral chest x-ray of October 12, 2017 FINDINGS: The lungs are mildly hyperinflated. The interstitial markings are prominent though stable. The heart and pulmonary vascularity are normal. The mediastinum is normal in width. There is calcification in the wall of the aortic arch. IMPRESSION: COPD. No pneumonia, CHF, nor other acute cardiopulmonary abnormality. Thoracic aortic atherosclerosis. Electronically Signed   By: David  Martinique M.D.   On: 05/19/2018 11:16   Ct Head Wo Contrast  Addendum Date: 05/19/2018   ADDENDUM REPORT: 05/19/2018 12:22 ADDENDUM: Study discussed by telephone with Dr. Carmin Muskrat on 05/19/2018 at noon. Electronically Signed   By: Genevie Ann M.D.   On: 05/19/2018 12:22   Result Date: 05/19/2018 CLINICAL DATA:  77 year old female status post fall yesterday. Subsequent headache, dizziness, nausea vomiting. EXAM: CT HEAD WITHOUT CONTRAST CT CERVICAL SPINE WITHOUT CONTRAST TECHNIQUE: Multidetector CT imaging of the head and cervical spine was performed following the standard protocol without intravenous contrast. Multiplanar CT image reconstructions of the cervical spine were also generated. COMPARISON:  Neck CT 02/05/2014. Cervical spine MRI 01/24/2011. FINDINGS: CT HEAD FINDINGS Brain: Broad-based mixed density but mostly hyperdense right side subdural hematoma (coronal image 39) measuring up to 7 millimeters in thickness. Trace leftward midline shift. No other significant intracranial mass effect. Trace subdural layering on the right tentorium. No other intracranial hemorrhage identified. No ventriculomegaly. No cortically based acute infarct identified. Patchy mild for age nonspecific cerebral white matter hypodensity. Left  basal ganglia dilated perivascular space (normal variant). Vascular: Calcified atherosclerosis at the skull base. Skull: No fracture identified. Sinuses/Orbits: Visualized paranasal sinuses and mastoids are stable and well pneumatized. Other: Broad-based right posterior scalp hematoma up to 9 millimeters in thickness. Underlying calvarium intact. No scalp soft tissue gas. Visualized orbit soft tissues are within normal limits. CT CERVICAL SPINE FINDINGS Alignment: Reversal of cervical lordosis, increased from prior studies. Degenerative appearing chronic anterolisthesis of C4 on C5 has also mildly progressed. New degenerative appearing mild anterolisthesis of C3 on C4, also with associated chronic facet degeneration. Bilateral posterior element alignment is within normal limits. Cervicothoracic junction alignment is within normal limits. Skull base and vertebrae: Visualized skull base  is intact. No atlanto-occipital dissociation. No cervical spine fracture identified. Soft tissues and spinal canal: No prevertebral fluid or swelling. No visible canal hematoma. Negative noncontrast neck soft tissues. Disc levels: Degenerative appearing anterolisthesis of C3 on C4 and C4 on C5 with multilevel left side upper cervical facet arthropathy. Chronic but progressed C5-C6 and C6-C7 disc degeneration and degenerative vertebral body sclerosis since 2015. Up to mild degenerative spinal stenosis, primarily at C5-C6. Upper chest: Visible upper thoracic levels appear stable and intact. Negative lung apices. Negative noncontrast thoracic inlet. Other: Absent dentition . IMPRESSION: 1. Right scalp hematoma associated with broad based right side subdural hematoma measuring up to 7 mm in thickness. No associated skull fracture identified. 2. Minimal intracranial mass effect with trace leftward midline shift. 3. No other acute traumatic injury identified in the head or cervical spine. 4. Progressed since 2015 degenerative appearing  cervical spine spondylolisthesis at C3-C4 and C4-C5. Similarly progressed disc and vertebral body degeneration at C5-C6 and C6-C7. Electronically Signed: By: Genevie Ann M.D. On: 05/19/2018 11:49   Ct Cervical Spine Wo Contrast  Addendum Date: 05/19/2018   ADDENDUM REPORT: 05/19/2018 12:22 ADDENDUM: Study discussed by telephone with Dr. Carmin Muskrat on 05/19/2018 at noon. Electronically Signed   By: Genevie Ann M.D.   On: 05/19/2018 12:22   Result Date: 05/19/2018 CLINICAL DATA:  77 year old female status post fall yesterday. Subsequent headache, dizziness, nausea vomiting. EXAM: CT HEAD WITHOUT CONTRAST CT CERVICAL SPINE WITHOUT CONTRAST TECHNIQUE: Multidetector CT imaging of the head and cervical spine was performed following the standard protocol without intravenous contrast. Multiplanar CT image reconstructions of the cervical spine were also generated. COMPARISON:  Neck CT 02/05/2014. Cervical spine MRI 01/24/2011. FINDINGS: CT HEAD FINDINGS Brain: Broad-based mixed density but mostly hyperdense right side subdural hematoma (coronal image 39) measuring up to 7 millimeters in thickness. Trace leftward midline shift. No other significant intracranial mass effect. Trace subdural layering on the right tentorium. No other intracranial hemorrhage identified. No ventriculomegaly. No cortically based acute infarct identified. Patchy mild for age nonspecific cerebral white matter hypodensity. Left basal ganglia dilated perivascular space (normal variant). Vascular: Calcified atherosclerosis at the skull base. Skull: No fracture identified. Sinuses/Orbits: Visualized paranasal sinuses and mastoids are stable and well pneumatized. Other: Broad-based right posterior scalp hematoma up to 9 millimeters in thickness. Underlying calvarium intact. No scalp soft tissue gas. Visualized orbit soft tissues are within normal limits. CT CERVICAL SPINE FINDINGS Alignment: Reversal of cervical lordosis, increased from prior studies.  Degenerative appearing chronic anterolisthesis of C4 on C5 has also mildly progressed. New degenerative appearing mild anterolisthesis of C3 on C4, also with associated chronic facet degeneration. Bilateral posterior element alignment is within normal limits. Cervicothoracic junction alignment is within normal limits. Skull base and vertebrae: Visualized skull base is intact. No atlanto-occipital dissociation. No cervical spine fracture identified. Soft tissues and spinal canal: No prevertebral fluid or swelling. No visible canal hematoma. Negative noncontrast neck soft tissues. Disc levels: Degenerative appearing anterolisthesis of C3 on C4 and C4 on C5 with multilevel left side upper cervical facet arthropathy. Chronic but progressed C5-C6 and C6-C7 disc degeneration and degenerative vertebral body sclerosis since 2015. Up to mild degenerative spinal stenosis, primarily at C5-C6. Upper chest: Visible upper thoracic levels appear stable and intact. Negative lung apices. Negative noncontrast thoracic inlet. Other: Absent dentition . IMPRESSION: 1. Right scalp hematoma associated with broad based right side subdural hematoma measuring up to 7 mm in thickness. No associated skull fracture identified. 2. Minimal intracranial mass effect  with trace leftward midline shift. 3. No other acute traumatic injury identified in the head or cervical spine. 4. Progressed since 2015 degenerative appearing cervical spine spondylolisthesis at C3-C4 and C4-C5. Similarly progressed disc and vertebral body degeneration at C5-C6 and C6-C7. Electronically Signed: By: Genevie Ann M.D. On: 05/19/2018 11:49    Pending Labs Unresulted Labs (From admission, onward)    Start     Ordered   05/19/18 1001  Urinalysis, Routine w reflex microscopic  Once,   STAT     05/19/18 1000          Vitals/Pain Today's Vitals   05/19/18 1137 05/19/18 1230 05/19/18 1300 05/19/18 1304  BP: (!) 184/81 (!) 171/82 (!) 194/78 (!) 194/78  Pulse: 68 68   68  Resp: _0 Temp:      TempSrc:      SpO2: 99% 98% 98% 98%  Weight:      Height:      PainSc:        Isolation Precautions No active isolations  Medications Medications  multivitamin tablet 1 tablet (has no administration in time range)  Vitamin D CAPS 2,000 Units (has no administration in time range)  dorzolamide-timolol (COSOPT) 22.3-6.8 MG/ML ophthalmic solution 1 drop (has no administration in time range)  rosuvastatin (CRESTOR) tablet 10 mg (has no administration in time range)  hydrOXYzine (ATARAX/VISTARIL) tablet 25 mg (has no administration in time range)  latanoprost (XALATAN) 0.005 % ophthalmic solution 1 drop (has no administration in time range)  potassium chloride (K-DUR) CR tablet 10 mEq (has no administration in time range)  losartan-hydrochlorothiazide (HYZAAR) 50-12.5 MG per tablet 1 tablet (has no administration in time range)  albuterol (PROVENTIL HFA;VENTOLIN HFA) 108 (90 Base) MCG/ACT inhaler 1-2 puff (has no administration in time range)  triamcinolone cream (KENALOG) 0.1 % 1 application (has no administration in time range)  0.9 %  sodium chloride infusion (has no administration in time range)  sodium chloride flush (NS) 0.9 % injection 3 mL (has no administration in time range)  sodium chloride flush (NS) 0.9 % injection 3 mL (has no administration in time range)  0.9 %  sodium chloride infusion (has no administration in time range)  acetaminophen (TYLENOL) tablet 650 mg (has no administration in time range)    Or  acetaminophen (TYLENOL) suppository 650 mg (has no administration in time range)  HYDROcodone-acetaminophen (NORCO/VICODIN) 5-325 MG per tablet 1-2 tablet (has no administration in time range)  zolpidem (AMBIEN) tablet 5 mg (has no administration in time range)  sodium phosphate (FLEET) 7-19 GM/118ML enema 1 enema (has no administration in time range)  ondansetron (ZOFRAN) tablet 4 mg ( Oral See Alternative 05/19/18 1300)    Or   ondansetron (ZOFRAN) injection 4 mg (4 mg Intravenous Given 05/19/18 1300)  levETIRAcetam (KEPPRA) tablet 500 mg (has no administration in time range)  ondansetron (ZOFRAN) 4 MG/2ML injection (has no administration in time range)    Mobility walks

## 2018-05-19 NOTE — ED Provider Notes (Signed)
[facility] DEPT Provider Note   CSN: [id number] Arrival date & time: 05/19/18  5625     History   Chief Complaint Chief Complaint  Patient presents with  . Fall  . Dizziness  . Emesis    HPI Alicia Dickson is a 77 y.o. female.  HPI Patient presents with her daughter who provides much of the HPI. She had episode of dizziness yesterday, fell.  When this occurred he about 24 hours ago. Since that time the patient has had diminished interactivity, headache, without focal weakness, without vision changes There is associated nausea, and she has had multiple episodes of vomiting. The patient herself is awake, migration of the history, though details are provided by the daughter. Patient does not take blood thinning medication, is typically awake and alert, active. She does have a history of COPD, is an active smoker.  Past Medical History:  Diagnosis Date  . Anxiety   . Cellulitis of right leg   . Cigarette smoker   . Diverticulosis of colon   . DJD (degenerative joint disease)   . GERD (gastroesophageal reflux disease)   . Glaucoma   . Hyperlipidemia   . Hypertension   . IBS (irritable bowel syndrome)   . Osteopenia     Patient Active Problem List   Diagnosis Date Noted  . COPD (chronic obstructive pulmonary disease) with emphysema ([place]) 10/19/2017  . Atopic eczema 12/19/2015  . Colitis, infectious 03/02/2015  . Colitis 03/02/2015  . Hypokalemia 03/02/2015  . Hyponatremia 03/02/2015  . Parotid mass 11/23/2013  . Eczematous dermatitis of eyelid 10/03/2013  . Cellulitis 03/22/2013  . Glaucoma 03/29/2009  . ANXIETY 03/04/2008  . DIVERTICULOSIS OF COLON 03/04/2008  . IRRITABLE BOWEL SYNDROME 03/04/2008  . DEGENERATIVE JOINT DISEASE 03/04/2008  . Disorder of bone and cartilage 03/04/2008  . CIGARETTE SMOKER 02/22/2008  . Hyperlipidemia 01/07/2008  . Essential hypertension 01/07/2008  . GERD 01/07/2008    Past Surgical History:    Procedure Laterality Date  . APPENDECTOMY     AGE 70  . COLONOSCOPY  07-02-10   per Dr. Henrene Pastor, severe diverticulosis, no polyps, repeat in 5 yrs   . TOTAL ABDOMINAL HYSTERECTOMY       OB History    Gravida  5   Para  5   Term      Preterm      AB      Living  5     SAB      TAB      Ectopic      Multiple      Live Births               Home Medications    Prior to Admission medications   Medication Sig Start Date End Date Taking? Authorizing Provider  ALPHAGAN P 0.1 % SOLN Place 1 drop into both eyes 2 (two) times daily. 10/08/17  Yes [provider]  aspirin 81 MG tablet Take 81 mg by mouth daily.   Yes [provider]  cetirizine (ZYRTEC) 10 MG tablet Take 10 mg by mouth daily.   Yes [provider]  Cholecalciferol (VITAMIN D) 2000 UNITS CAPS Take 2,000 Units by mouth daily.    Yes [provider]  Cyanocobalamin (VITAMIN B-12 PO) Take 1 tablet by mouth daily.    Yes [provider]  dorzolamide-timolol (COSOPT) 22.3-6.8 MG/ML ophthalmic solution Place 1 drop into both eyes 2 (two) times daily. 11/14/13  Yes [provider]  hydrOXYzine (ATARAX/VISTARIL) 25 MG tablet TAKE 1 TABLET EVERY 4 HOURS AS NEEDED FOR ITCHING Patient taking differently: Take 25 mg by mouth every 4 (four) hours as needed for itching.  07/12/17  Yes Laurey Morale, MD  latanoprost (XALATAN) 0.005 % ophthalmic solution Place 1 drop into both eyes at bedtime. 10/04/17  Yes [provider]  losartan-hydrochlorothiazide (HYZAAR) 50-12.5 MG tablet Take 1 tablet by mouth daily. 10/29/17  Yes Laurey Morale, MD  Multiple Vitamin (MULTIVITAMIN) tablet Take 1 tablet by mouth daily.     Yes [provider]  potassium chloride (KLOR-CON 10) 10 MEQ tablet Take 1 tablet (10 mEq total) by mouth daily. 10/19/17  Yes Laurey Morale, MD  rosuvastatin (CRESTOR) 10 MG tablet Take 1 tablet (10 mg total) by mouth daily. 06/16/17  Yes Laurey Morale, MD  triamcinolone cream (KENALOG) 0.1 % Apply 1 application topically 2 (two) times daily. Never apply to face or groin 05/03/18  Yes [provider]  VENTOLIN HFA 108 (90 Base) MCG/ACT inhaler INHALE 2 PUFFS BY MOUTH EVERY 6 HOURS AS NEEDED FOR WHEEZE OR SHORTNESS OF BREATH Patient taking differently: Inhale 1-2 puffs into the lungs every 6 (six) hours as needed for wheezing or shortness of breath.  12/15/17  Yes Laurey Morale, MD  halobetasol (ULTRAVATE) 0.05 % cream Apply topically 2 (two) times daily. Patient not taking: Reported on 05/19/2018 06/16/17   Laurey Morale, MD    Family History Family History  Problem Relation Age of Onset  . Colon cancer Father   . Cancer Father        THROAT, COLON, ESOPHAGEAL  . Hypertension Mother   . Hyperlipidemia Mother   . Diabetes Son     Social History Social History   Tobacco Use  . Smoking status: Current Every Day Smoker    Years: 20.00    Types: Cigarettes  . Smokeless tobacco: Never Used  Substance Use Topics  . Alcohol use: Yes    Alcohol/week: 0.0 standard drinks    Comment: social - wine  . Drug use: No     Allergies   Penicillins   Review of Systems Review of Systems  Constitutional:       Per HPI, otherwise negative  HENT:       Per HPI, otherwise negative  Respiratory:       Per HPI, otherwise negative  Cardiovascular:       Per HPI, otherwise negative  Gastrointestinal: Positive for nausea and vomiting.  Endocrine:       Negative aside from HPI  Genitourinary:       Neg aside from HPI   Musculoskeletal:       Per HPI, otherwise negative  Skin: Negative.   Neurological: Negative for syncope.     Physical Exam Updated Vital Signs BP (!) 184/81   Pulse 68   Temp 97.9 F (36.6 C) (Oral)   Resp 15   Ht 5' (1.524 m)   Wt 44 kg   SpO2 99%   BMI 18.94 kg/m   Physical Exam  Constitutional: She is oriented to person, place, and time. She appears well-developed and well-nourished. No  distress.  HENT:  Head: Normocephalic.  Hematoma, right parietal  Eyes: Conjunctivae and EOM are normal.  Neck:  She describes neck pain, but has no midline tenderness.  When she does have some soreness with motion throughout the mid and paraspinal areas. No crepitus, no step-off  Cardiovascular: Normal rate and regular  rhythm.  Pulmonary/Chest: Effort normal and breath sounds normal. No stridor. No respiratory distress.  Abdominal: She exhibits no distension.  Musculoskeletal: She exhibits no edema.  Neurological: She is alert and oriented to person, place, and time. She displays atrophy. No cranial nerve deficit.  Substantial atrophy, but no asymmetry of strength, nor gross discoordination. Face is symmetric, speech is clear, brief.  Skin: Skin is warm and dry.  Psychiatric: She is slowed and withdrawn.  Nursing note and vitals reviewed.    ED Treatments / Results  Labs (all labs ordered are listed, but only abnormal results are displayed) Labs Reviewed  BASIC METABOLIC PANEL - Abnormal; Notable for the following components:      Result Value   Sodium 131 (*)    Potassium 3.0 (*)    Chloride 96 (*)    Glucose, Bld 116 (*)    All other components within normal limits  CBG MONITORING, ED - Abnormal; Notable for the following components:   Glucose-Capillary 123 (*)    All other components within normal limits  CBC  URINALYSIS, ROUTINE W REFLEX MICROSCOPIC  CBG MONITORING, ED    EKG EKG Interpretation  Date/Time:  Thursday May 19 2018 10:03:47 EDT Ventricular Rate:  73 PR Interval:    QRS Duration: 89 QT Interval:  398 QTC Calculation: 439 R Axis:   81 Text Interpretation:  Sinus rhythm Probable left atrial enlargement Borderline right axis deviation Probable left ventricular hypertrophy Baseline wander in lead(s) V3 Abnormal ekg Confirmed by Carmin Muskrat [phone]) on 05/19/2018 10:37:23 AM   Radiology Dg Chest 2 View  Result Date: 05/19/2018 CLINICAL DATA:   The patient was dizzy yesterday morning and subsequently fell. Persistent dizziness and nausea. Current smoker, history of hypertension. EXAM: CHEST - 2 VIEW COMPARISON:  PA and lateral chest x-ray of October 12, 2017 FINDINGS: The lungs are mildly hyperinflated. The interstitial markings are prominent though stable. The heart and pulmonary vascularity are normal. The mediastinum is normal in width. There is calcification in the wall of the aortic arch. IMPRESSION: COPD. No pneumonia, CHF, nor other acute cardiopulmonary abnormality. Thoracic aortic atherosclerosis. Electronically Signed   By: David  Martinique M.D.   On: 05/19/2018 11:16   Ct Head Wo Contrast  Result Date: 05/19/2018 CLINICAL DATA:  77 year old female status post fall yesterday. Subsequent headache, dizziness, nausea vomiting. EXAM: CT HEAD WITHOUT CONTRAST CT CERVICAL SPINE WITHOUT CONTRAST TECHNIQUE: Multidetector CT imaging of the head and cervical spine was performed following the standard protocol without intravenous contrast. Multiplanar CT image reconstructions of the cervical spine were also generated. COMPARISON:  Neck CT 02/05/2014. Cervical spine MRI 01/24/2011. FINDINGS: CT HEAD FINDINGS Brain: Broad-based mixed density but mostly hyperdense right side subdural hematoma (coronal image 39) measuring up to 7 millimeters in thickness. Trace leftward midline shift. No other significant intracranial mass effect. Trace subdural layering on the right tentorium. No other intracranial hemorrhage identified. No ventriculomegaly. No cortically based acute infarct identified. Patchy mild for age nonspecific cerebral white matter hypodensity. Left basal ganglia dilated perivascular space (normal variant). Vascular: Calcified atherosclerosis at the skull base. Skull: No fracture identified. Sinuses/Orbits: Visualized paranasal sinuses and mastoids are stable and well pneumatized. Other: Broad-based right posterior scalp hematoma up to 9 millimeters  in thickness. Underlying calvarium intact. No scalp soft tissue gas. Visualized orbit soft tissues are within normal limits. CT CERVICAL SPINE FINDINGS Alignment: Reversal of cervical lordosis, increased from prior studies. Degenerative appearing chronic anterolisthesis of C4 on C5 has also mildly progressed. New degenerative  appearing mild anterolisthesis of C3 on C4, also with associated chronic facet degeneration. Bilateral posterior element alignment is within normal limits. Cervicothoracic junction alignment is within normal limits. Skull base and vertebrae: Visualized skull base is intact. No atlanto-occipital dissociation. No cervical spine fracture identified. Soft tissues and spinal canal: No prevertebral fluid or swelling. No visible canal hematoma. Negative noncontrast neck soft tissues. Disc levels: Degenerative appearing anterolisthesis of C3 on C4 and C4 on C5 with multilevel left side upper cervical facet arthropathy. Chronic but progressed C5-C6 and C6-C7 disc degeneration and degenerative vertebral body sclerosis since 2015. Up to mild degenerative spinal stenosis, primarily at C5-C6. Upper chest: Visible upper thoracic levels appear stable and intact. Negative lung apices. Negative noncontrast thoracic inlet. Other: Absent dentition . IMPRESSION: 1. Right scalp hematoma associated with broad based right side subdural hematoma measuring up to 7 mm in thickness. No associated skull fracture identified. 2. Minimal intracranial mass effect with trace leftward midline shift. 3. No other acute traumatic injury identified in the head or cervical spine. 4. Progressed since 2015 degenerative appearing cervical spine spondylolisthesis at C3-C4 and C4-C5. Similarly progressed disc and vertebral body degeneration at C5-C6 and C6-C7. Electronically Signed   By: Genevie Ann M.D.   On: 05/19/2018 11:49   Ct Cervical Spine Wo Contrast  Result Date: 05/19/2018 CLINICAL DATA:  77 year old female status post fall  yesterday. Subsequent headache, dizziness, nausea vomiting. EXAM: CT HEAD WITHOUT CONTRAST CT CERVICAL SPINE WITHOUT CONTRAST TECHNIQUE: Multidetector CT imaging of the head and cervical spine was performed following the standard protocol without intravenous contrast. Multiplanar CT image reconstructions of the cervical spine were also generated. COMPARISON:  Neck CT 02/05/2014. Cervical spine MRI 01/24/2011. FINDINGS: CT HEAD FINDINGS Brain: Broad-based mixed density but mostly hyperdense right side subdural hematoma (coronal image 39) measuring up to 7 millimeters in thickness. Trace leftward midline shift. No other significant intracranial mass effect. Trace subdural layering on the right tentorium. No other intracranial hemorrhage identified. No ventriculomegaly. No cortically based acute infarct identified. Patchy mild for age nonspecific cerebral white matter hypodensity. Left basal ganglia dilated perivascular space (normal variant). Vascular: Calcified atherosclerosis at the skull base. Skull: No fracture identified. Sinuses/Orbits: Visualized paranasal sinuses and mastoids are stable and well pneumatized. Other: Broad-based right posterior scalp hematoma up to 9 millimeters in thickness. Underlying calvarium intact. No scalp soft tissue gas. Visualized orbit soft tissues are within normal limits. CT CERVICAL SPINE FINDINGS Alignment: Reversal of cervical lordosis, increased from prior studies. Degenerative appearing chronic anterolisthesis of C4 on C5 has also mildly progressed. New degenerative appearing mild anterolisthesis of C3 on C4, also with associated chronic facet degeneration. Bilateral posterior element alignment is within normal limits. Cervicothoracic junction alignment is within normal limits. Skull base and vertebrae: Visualized skull base is intact. No atlanto-occipital dissociation. No cervical spine fracture identified. Soft tissues and spinal canal: No prevertebral fluid or swelling. No  visible canal hematoma. Negative noncontrast neck soft tissues. Disc levels: Degenerative appearing anterolisthesis of C3 on C4 and C4 on C5 with multilevel left side upper cervical facet arthropathy. Chronic but progressed C5-C6 and C6-C7 disc degeneration and degenerative vertebral body sclerosis since 2015. Up to mild degenerative spinal stenosis, primarily at C5-C6. Upper chest: Visible upper thoracic levels appear stable and intact. Negative lung apices. Negative noncontrast thoracic inlet. Other: Absent dentition . IMPRESSION: 1. Right scalp hematoma associated with broad based right side subdural hematoma measuring up to 7 mm in thickness. No associated skull fracture identified. 2. Minimal intracranial mass  effect with trace leftward midline shift. 3. No other acute traumatic injury identified in the head or cervical spine. 4. Progressed since 2015 degenerative appearing cervical spine spondylolisthesis at C3-C4 and C4-C5. Similarly progressed disc and vertebral body degeneration at C5-C6 and C6-C7. Electronically Signed   By: Genevie Ann M.D.   On: 05/19/2018 11:49    Procedures Procedures (including critical care time)  Medications Ordered in ED Medications - No data to display   Initial Impression / Assessment and Plan / ED Course  I have reviewed the triage vital signs and the nursing notes.  Pertinent labs & imaging results that were available during my care of the patient were reviewed by me and considered in my medical decision making (see chart for details).    Concern from behavioral changes, nausea, vomiting after trauma and dizziness, the patient had head CT, neck CT, as well as labs performed after my evaluation.  Subsequently discussed the patient's findings with our radiologist with notation of right-sided subdural hematoma, 7 mm.  On repeat exam the patient remains awake, though quiet. She continues to deny focal neurologic complaints, does describe a headache. I discussed  all findings with her and her daughter, and subsequently with our neurosurgical colleagues. With concern for subdural hematoma, headache, nausea, she will require transfer to our affiliated care center for neurosurgical care.   Final Clinical Impressions(s) / ED Diagnoses  Fall, initial encounter Subdural hematoma, initial encounter  CRITICAL CARE Performed by: Carmin Muskrat Total critical care time: 35 minutes Critical care time was exclusive of separately billable procedures and treating other patients. Critical care was necessary to treat or prevent imminent or life-threatening deterioration. Critical care was time spent personally by me on the following activities: development of treatment plan with patient and/or surrogate as well as nursing, discussions with consultants, evaluation of patient's response to treatment, examination of patient, obtaining history from patient or surrogate, ordering and performing treatments and interventions, ordering and review of laboratory studies, ordering and review of radiographic studies, pulse oximetry and re-evaluation of patient's condition.    Carmin Muskrat, MD 05/19/18 1537

## 2018-05-19 NOTE — Telephone Encounter (Signed)
Called and spoke with pts daughter and she was advised that per Dr. Sarajane Jews, she will need to be evaluated in ER ---Dr. Sarajane Jews wanted to make sure that she does not have a bleed in her head.  The daugher stated that she felt that the pt did not hit her head that hard and that she caught herself some.  I advised the daughter that per Dr. Shon Baton those symptoms it would be better that the pt be evaluated in the ER.  pts daughter stated that the pt has refused to go to the ER to be seen.  Daughter stated that she will try and get her to go   pts BP was 163/93 and the daughter did get her to take her BP meds and the pt did keep those down. Pt only vomitted x 2 .

## 2018-05-19 NOTE — ED Notes (Signed)
Patient transported to X-Ray 

## 2018-05-19 NOTE — ED Notes (Signed)
ED Provider at bedside. 

## 2018-05-19 NOTE — ED Triage Notes (Signed)
Pt brought in by family who was dizzy yesterday morning and fell. Unsure if LOC or not. Pt still dizzy and nauseated today. Reports pt put ice on her bump on her head so it has decreased in size today. Takes ASA

## 2018-05-19 NOTE — Plan of Care (Signed)

## 2018-05-19 NOTE — Telephone Encounter (Signed)
Pt's daughter (pt was with the daughter at the time of the call) called to report pt fall that occurred yesterday morning. Pt stated that she fell after feeling dizzy and hit the back and to the right of her head. Pt iced her head. Daughter stated that she could not feel a bump this am. Pt c/o pressure in her head. Pt c/o neck and back pain and continues to be dizzy. Denies headache, vision changes. Pt has thrown up twice since the fall. Pt is alert and oriented. There are no scratches or lacerations. Pt refused to go to the ED for evaluation.  Reason for Disposition . Vomiting once or more  Answer Assessment - Initial Assessment Questions 1. MECHANISM: "How did the injury happen?" For falls, ask: "What height did you fall from?" and "What surface did you fall against?"      Walking in driveway and got dizzy and fell down, fell against concrete 2. ONSET: "When did the injury happen?" (Minutes or hours ago)      Yesterday early in the morning 3. NEUROLOGIC SYMPTOMS: "Was there any loss of consciousness?" "Are there any other neurological symptoms?"      Pt doesn't think she lost consciousness- feeling dizzy and pressure 4. MENTAL STATUS: "Does the person know who he is, who you are, and where he is?"      yes 5. LOCATION: "What part of the head was hit?"      Back and to the right of the head 6. SCALP APPEARANCE: "What does the scalp look like? Is it bleeding now?" If so, ask: "Is it difficult to stop?"      There is a bump but no cuts or bleeding 7. SIZE: For cuts, bruises, or swelling, ask: "How large is it?" (e.g., inches or centimeters)      Does not feel a bump now. It previously was the size of a quarter 8. PAIN: "Is there any pain?" If so, ask: "How bad is it?"  (e.g., Scale 1-10; or mild, moderate, severe)     No pain just pressure 9. TETANUS: For any breaks in the skin, ask: "When was the last tetanus booster?"     n/a 10. OTHER SYMPTOMS: "Do you have any other symptoms?" (e.g., neck  pain, vomiting)      neck and back pain 11. PREGNANCY: "Is there any chance you are pregnant?" "When was your last menstrual period?"      n/a  Protocols used: HEAD INJURY-A-AH

## 2018-05-19 NOTE — ED Notes (Signed)
4th floor at [facility] called, report given.

## 2018-05-20 ENCOUNTER — Observation Stay (HOSPITAL_COMMUNITY): Payer: Medicare HMO

## 2018-05-20 DIAGNOSIS — S065X0A Traumatic subdural hemorrhage without loss of consciousness, initial encounter: Secondary | ICD-10-CM | POA: Diagnosis not present

## 2018-05-20 MED ORDER — LEVETIRACETAM 500 MG PO TABS: 500.0000 mg | ORAL_TABLET | Freq: Two times a day (BID) | ORAL | 0 refills | Status: AC

## 2018-05-20 MED ORDER — HYDROCODONE-ACETAMINOPHEN 5-325 MG PO TABS: 1.0000 | ORAL_TABLET | ORAL | 0 refills | Status: AC | PRN

## 2018-05-20 MED ORDER — PROMETHAZINE HCL 12.5 MG PO TABS: 12.5000 mg | ORAL_TABLET | Freq: Four times a day (QID) | ORAL | 0 refills | Status: AC | PRN

## 2018-05-20 NOTE — Discharge Summary (Signed)
Physician Discharge Summary  Patient ID: Alicia Dickson MRN: [id number] DOB/AGE: 30-Jan-1941 73 y.o.  Admit date: 05/19/2018 Discharge date: 05/20/2018  Admission Diagnoses:  SDH  Discharge Diagnoses:  Same Active Problems:   Subdural hematoma [facility])  Discharged Condition: Stable  Hospital Course:  Alicia Dickson is a 77 y.o. female who presented to ER 9/19 due to persistent dizziness, nausea and vomiting after falling striking head the day prior. Head CT revealed small, 65mm right SDH without mass effect or MLS. She was neurologically intact. She was admitted for observation.She remained neurologically intact and had a repeat head CT the morning of 9/20 which was stable. She was cleared for discharge. She will need to complete 7 day course of keppra for post traumatic seizure prophylaxis.   She was noted to be on ASA. This was d/c and will need to be discontinued at discharge until a repeat head CT shows SDH has resolved. Because she is on ASA for preventative purposes, will leave up to PCP if they believe risks>benefis for future issues.  Treatments: Surgery - None  Discharge Exam: Blood pressure 133/74, pulse 85, temperature 98.3 F (36.8 C), temperature source Oral, resp. rate (!) 21, height 5' (1.524 m), weight 41.2 kg, SpO2 97 %. Awake, alert, oriented Speech fluent, appropriate CN grossly intact 5/5 BUE/BLE No drift Normal FNF  Disposition: Discharge disposition: 01-Home or Self Care     Home  Discharge Instructions    Call MD for:  severe uncontrolled pain   Complete by:  As directed    Diet general   Complete by:  As directed    Driving Restrictions   Complete by:  As directed    Do not drive until given clearance.   Increase activity slowly   Complete by:  As directed      Allergies as of 05/20/2018      Reactions   Penicillins Rash   Has patient had a PCN reaction causing immediate rash, facial/tongue/throat swelling, SOB or lightheadedness with  hypotension: Yes Has patient had a PCN reaction causing severe rash involving mucus membranes or skin necrosis: No Has patient had a PCN reaction that required hospitalization: No Has patient had a PCN reaction occurring within the last 10 years: No If all of the above answers are "NO", then may proceed with Cephalosporin use.      Medication List    STOP taking these medications   aspirin 81 MG tablet     TAKE these medications   ALPHAGAN P 0.1 % Soln Generic drug:  brimonidine Place 1 drop into both eyes 2 (two) times daily.   cetirizine 10 MG tablet Commonly known as:  ZYRTEC Take 10 mg by mouth daily.   dorzolamide-timolol 22.3-6.8 MG/ML ophthalmic solution Commonly known as:  COSOPT Place 1 drop into both eyes 2 (two) times daily.   halobetasol 0.05 % cream Commonly known as:  ULTRAVATE Apply topically 2 (two) times daily.   HYDROcodone-acetaminophen 5-325 MG tablet Commonly known as:  NORCO/VICODIN Take 1 tablet by mouth every 4 (four) hours as needed for moderate pain.   hydrOXYzine 25 MG tablet Commonly known as:  ATARAX/VISTARIL TAKE 1 TABLET EVERY 4 HOURS AS NEEDED FOR ITCHING What changed:  See the new instructions.   latanoprost 0.005 % ophthalmic solution Commonly known as:  XALATAN Place 1 drop into both eyes at bedtime.   levETIRAcetam 500 MG tablet Commonly known as:  KEPPRA Take 1 tablet (500 mg total) by mouth 2 (two) times  daily.   losartan-hydrochlorothiazide 50-12.5 MG tablet Commonly known as:  HYZAAR Take 1 tablet by mouth daily.   multivitamin tablet Take 1 tablet by mouth daily.   potassium chloride 10 MEQ tablet Commonly known as:  K-DUR Take 1 tablet (10 mEq total) by mouth daily.   promethazine 12.5 MG tablet Commonly known as:  PHENERGAN Take 1 tablet (12.5 mg total) by mouth every 6 (six) hours as needed for nausea or vomiting.   rosuvastatin 10 MG tablet Commonly known as:  CRESTOR Take 1 tablet (10 mg total) by mouth  daily.   triamcinolone cream 0.1 % Commonly known as:  KENALOG Apply 1 application topically 2 (two) times daily. Never apply to face or groin   VENTOLIN HFA 108 (90 Base) MCG/ACT inhaler Generic drug:  albuterol INHALE 2 PUFFS BY MOUTH EVERY 6 HOURS AS NEEDED FOR WHEEZE OR SHORTNESS OF BREATH What changed:  See the new instructions.   VITAMIN B-12 PO Take 1 tablet by mouth daily.   Vitamin D 2000 units Caps Take 2,000 Units by mouth daily.        SignedTraci Sermon 05/20/2018, 7:05 AM

## 2018-05-20 NOTE — Progress Notes (Signed)
Pt being discharged home. Pt given AVS and prescriptions. Pt and daughter verbalize understanding of instructions. Pt vitals stable. Pt being taken home by daughter. Pt escorted out via staff.

## 2018-05-20 NOTE — Care Management Obs Status (Signed)
[facility] NOTIFICATION   Patient Details  Name: Alicia Dickson MRN: [id number] Date of Birth: 08/06/1941   Medicare Observation Status Notification Given:  Yes    Ella Bodo, RN 05/20/2018, 10:32 AM

## 2018-05-20 NOTE — Plan of Care (Signed)

## 2018-05-20 NOTE — Progress Notes (Addendum)
Neurosurgery Progress Note  No issues overnight. Nausea and dizziness significantly improved Denies focal deficits Eager for d/c when possible  EXAM:  BP 133/74 (BP Location: Right Arm)   Pulse 85   Temp 98.3 F (36.8 C) (Oral)   Resp (!) 21   Ht 5' (1.524 m)   Wt 41.2 kg   SpO2 97%   BMI 17.74 kg/m   Awake, alert, oriented  Speech fluent, appropriate  CN grossly intact  5/5 BUE/BLE  No drift  PLAN Doing well this am Remains neurologically intact Repeat head CT pending. If stable, okay for d/c. Orders signed.   I have seen and examined Alicia Dickson and agree with the exam, impression, and plan as documented in the note by Ferne Reus, PA-C. CT head is stable, neurologically intact, improved HA, N/V. Will d/c home with outpatient f/u  Consuella Lose, MD [facility]

## 2018-05-23 ENCOUNTER — Telehealth: Payer: Self-pay | Admitting: *Deleted

## 2018-05-23 NOTE — Telephone Encounter (Signed)
Transition Care Management Follow-up Telephone Call  Per Discharge Summary: Admit date: 05/19/2018 Discharge date: 05/20/2018  Admission Diagnoses:  SDH  Discharge Diagnoses:  Same Active Problems:   Subdural hematoma ([place])  Discharged Condition: Stable  --   How have you been since you were released from the hospital? "Just weak."   Do you understand why you were in the hospital? yes   Do you understand the discharge instructions? yes   Where were you discharged to? Home   Items Reviewed:  Medications reviewed: yes  Allergies reviewed: yes  Dietary changes reviewed: yes  Referrals reviewed: yes   Functional Questionnaire:   Activities of Daily Living (ADLs):   She states they are independent in the following: ambulation, bathing and hygiene, feeding, continence, grooming, toileting and dressing States they require assistance with the following: needs assistance w/ chores/duties around the house. Daughter helps out after work.   Any transportation issues/concerns?: no, daughter will drive her to appts   Any patient concerns? no   Confirmed importance and date/time of follow-up visits scheduled yes  Provider Appointment booked with Dr. [name] 05/25/18 @ 3:30pm  Confirmed with patient if condition begins to worsen call PCP or go to the ER.  Patient was given the office number and encouraged to call back with question or concerns.  : yes

## 2018-05-25 ENCOUNTER — Encounter: Payer: Self-pay | Admitting: Family Medicine

## 2018-05-25 ENCOUNTER — Ambulatory Visit (INDEPENDENT_AMBULATORY_CARE_PROVIDER_SITE_OTHER): Payer: Medicare HMO | Admitting: Family Medicine

## 2018-05-25 VITALS — BP 130/72 | HR 86 | Temp 98.4°F | Wt 90.4 lb

## 2018-05-25 DIAGNOSIS — E876 Hypokalemia: Secondary | ICD-10-CM

## 2018-05-25 DIAGNOSIS — I10 Essential (primary) hypertension: Secondary | ICD-10-CM

## 2018-05-25 DIAGNOSIS — S060X0D Concussion without loss of consciousness, subsequent encounter: Secondary | ICD-10-CM

## 2018-05-25 DIAGNOSIS — S065XAA Traumatic subdural hemorrhage with loss of consciousness status unknown, initial encounter: Secondary | ICD-10-CM

## 2018-05-25 DIAGNOSIS — S065X9A Traumatic subdural hemorrhage with loss of consciousness of unspecified duration, initial encounter: Secondary | ICD-10-CM | POA: Diagnosis not present

## 2018-05-25 MED ORDER — LOSARTAN POTASSIUM 50 MG PO TABS: 50.0000 mg | ORAL_TABLET | Freq: Every day | ORAL | 3 refills | Status: DC

## 2018-05-25 MED ORDER — POTASSIUM CHLORIDE ER 10 MEQ PO TBCR: 10.0000 meq | EXTENDED_RELEASE_TABLET | Freq: Two times a day (BID) | ORAL | 3 refills | Status: DC

## 2018-05-25 NOTE — Progress Notes (Signed)
   Subjective:    Patient ID: Alicia Dickson, female    DOB: 1940-10-29, 77 y.o.   MRN: [id number]  HPI Here with her daughter to follow up from a hospital stay from 05-19-18 to 05-20-18 after she feel at home the day before. She notes that she has felt a little dizzy or lightheaded off and on for several weeks. Then that day as she bent forward to pick up a newspaper and stood back up she suddenly felt faint and fell over backwards. She struck the back of her head on the pavement. She then complained of nausea and vomiting, headache, and dizziness for the next 24 hours. When she told her daughter about this, she called [place] and we directed her to take her mother to the ER. While there a head CT revealed a 7 mm subdural hematoma that caused only minimal midline shift. She was observed closely over night, and the next morning a repeat head CT showed no further changes and no further bleeding. She was sent home, and she has been living with her daughter since then. She has improved a bit but is still weak and unsteady on her feet. She has a slight headache and is slightly sensitive to light. Her nausea has almost subsided but her appetite is still poor. She is drinking plenty of water. No chest pain or SOB or palpitations. While in the hospital her daily aspirin was stopped, and she was given a 7 day course of Keppra as seizure prevention.   Review of Systems  Constitutional: Negative.   Respiratory: Negative.   Cardiovascular: Negative.   Gastrointestinal: Negative.   Genitourinary: Negative.   Neurological: Positive for dizziness, light-headedness and headaches. Negative for facial asymmetry, speech difficulty, weakness and numbness.       Objective:   Physical Exam  Constitutional: She is oriented to person, place, and time.  Weak, needs assistance to walk   HENT:  Right Ear: External ear normal.  Left Ear: External ear normal.  Nose: Nose normal.  Mouth/Throat: Oropharynx is clear and moist.    Small hematoma over the right occipital scalp   Eyes: Pupils are equal, round, and reactive to light. Conjunctivae and EOM are normal.  Neck: No thyromegaly present.  Cardiovascular: Normal rate, regular rhythm, normal heart sounds and intact distal pulses.  Pulmonary/Chest: Effort normal and breath sounds normal.  Lymphadenopathy:    She has no cervical adenopathy.  Neurological: She is alert and oriented to person, place, and time. No cranial nerve deficit. She exhibits normal muscle tone. Coordination abnormal.          Assessment & Plan:  She is recovering from a small subdural hematoma and a mild concussion. To some extent her headache and weakness and dizziness are to be expected. Prior to the fall she may have been dehydrated and a little hypotensive. We will stop the HCTZ and change to plain Losartan 50 mg daily. Her potassium in the hospital was low at 3.0, so we will increase the potassium dose to 10 mEq BID. She is due for a complete physical here in a few weeks, so we will follow her up at that time. I encouraged her to drink plenty of water daily and to always use a walker (she has one at home).  [name], MD

## 2018-05-26 DIAGNOSIS — M79674 Pain in right toe(s): Secondary | ICD-10-CM | POA: Diagnosis not present

## 2018-05-26 DIAGNOSIS — B351 Tinea unguium: Secondary | ICD-10-CM | POA: Diagnosis not present

## 2018-05-26 DIAGNOSIS — M79675 Pain in left toe(s): Secondary | ICD-10-CM | POA: Diagnosis not present

## 2018-06-15 DIAGNOSIS — H524 Presbyopia: Secondary | ICD-10-CM | POA: Diagnosis not present

## 2018-06-17 ENCOUNTER — Ambulatory Visit (INDEPENDENT_AMBULATORY_CARE_PROVIDER_SITE_OTHER): Payer: Medicare HMO | Admitting: Family Medicine

## 2018-06-17 ENCOUNTER — Encounter: Payer: Self-pay | Admitting: Family Medicine

## 2018-06-17 VITALS — BP 122/80 | HR 84 | Temp 97.6°F | Ht 60.0 in | Wt 92.1 lb

## 2018-06-17 DIAGNOSIS — I10 Essential (primary) hypertension: Secondary | ICD-10-CM | POA: Diagnosis not present

## 2018-06-17 DIAGNOSIS — E876 Hypokalemia: Secondary | ICD-10-CM

## 2018-06-17 DIAGNOSIS — J439 Emphysema, unspecified: Secondary | ICD-10-CM

## 2018-06-17 DIAGNOSIS — S065X9A Traumatic subdural hemorrhage with loss of consciousness of unspecified duration, initial encounter: Secondary | ICD-10-CM | POA: Diagnosis not present

## 2018-06-17 DIAGNOSIS — K219 Gastro-esophageal reflux disease without esophagitis: Secondary | ICD-10-CM | POA: Diagnosis not present

## 2018-06-17 DIAGNOSIS — E782 Mixed hyperlipidemia: Secondary | ICD-10-CM

## 2018-06-17 DIAGNOSIS — S065XAA Traumatic subdural hemorrhage with loss of consciousness status unknown, initial encounter: Secondary | ICD-10-CM

## 2018-06-17 LAB — CBC WITH DIFFERENTIAL/PLATELET
BASOS PCT: 0.5 % (ref 0.0–3.0)
Basophils Absolute: 0 10*3/uL (ref 0.0–0.1)
EOS ABS: 0.2 10*3/uL (ref 0.0–0.7)
Eosinophils Relative: 4.7 % (ref 0.0–5.0)
HCT: 39.8 % (ref 36.0–46.0)
Hemoglobin: 13.5 g/dL (ref 12.0–15.0)
LYMPHS ABS: 1.5 10*3/uL (ref 0.7–4.0)
Lymphocytes Relative: 33.7 % (ref 12.0–46.0)
MCHC: 34 g/dL (ref 30.0–36.0)
MCV: 93.1 fl (ref 78.0–100.0)
Monocytes Absolute: 0.5 10*3/uL (ref 0.1–1.0)
Monocytes Relative: 12.1 % — ABNORMAL HIGH (ref 3.0–12.0)
NEUTROS ABS: 2.2 10*3/uL (ref 1.4–7.7)
Neutrophils Relative %: 49 % (ref 43.0–77.0)
PLATELETS: 315 10*3/uL (ref 150.0–400.0)
RBC: 4.27 Mil/uL (ref 3.87–5.11)
RDW: 13.1 % (ref 11.5–15.5)
WBC: 4.5 10*3/uL (ref 4.0–10.5)

## 2018-06-17 LAB — BASIC METABOLIC PANEL
BUN: 20 mg/dL (ref 6–23)
CALCIUM: 9.8 mg/dL (ref 8.4–10.5)
CO2: 28 mEq/L (ref 19–32)
CREATININE: 0.77 mg/dL (ref 0.40–1.20)
Chloride: 101 mEq/L (ref 96–112)
GFR: 76.91 mL/min (ref >60.00)
Glucose, Bld: 94 mg/dL (ref 70–99)
Potassium: 4.5 mEq/L (ref 3.5–5.1)
Sodium: 138 mEq/L (ref 135–145)

## 2018-06-17 LAB — HEPATIC FUNCTION PANEL
ALT: 16 U/L (ref 0–35)
AST: 27 U/L (ref 0–37)
Albumin: 4.3 g/dL (ref 3.5–5.2)
Alkaline Phosphatase: 84 U/L (ref 39–117)
BILIRUBIN TOTAL: 0.4 mg/dL (ref 0.2–1.2)
Bilirubin, Direct: 0.1 mg/dL (ref 0.0–0.3)
Total Protein: 6.7 g/dL (ref 6.0–8.3)

## 2018-06-17 LAB — POC URINALSYSI DIPSTICK (AUTOMATED)
Glucose, UA: NEGATIVE
KETONES UA: NEGATIVE
LEUKOCYTES UA: NEGATIVE
NITRITE UA: NEGATIVE
Protein, UA: POSITIVE — AB
Spec Grav, UA: 1.025 (ref 1.010–1.025)
Urobilinogen, UA: 0.2 E.U./dL
pH, UA: 6 (ref 5.0–8.0)

## 2018-06-17 LAB — LIPID PANEL
CHOL/HDL RATIO: 2
Cholesterol: 167 mg/dL (ref 0–200)
HDL: 67.9 mg/dL (ref >39.00)
LDL CALC: 80 mg/dL (ref 0–99)
NonHDL: 99.45
Triglycerides: 97 mg/dL (ref 0.0–149.0)
VLDL: 19.4 mg/dL (ref 0.0–40.0)

## 2018-06-17 LAB — TSH: TSH: 4.02 u[IU]/mL (ref 0.35–4.50)

## 2018-06-17 MED ORDER — ROSUVASTATIN CALCIUM 10 MG PO TABS: 10.0000 mg | ORAL_TABLET | Freq: Every day | ORAL | 3 refills | Status: AC

## 2018-06-17 NOTE — Progress Notes (Signed)
   Subjective:    Patient ID: Alicia Dickson, female    DOB: 05-11-1941, 77 y.o.   MRN: [id number]  HPI Here to follow up on issues. She feels fine today and has no concerns. Her BP has been stable at home. Last month she had a dizzy spell and fell, striking her head on the ground. She sustained a small subdural hematoma but this stabilized quickly. We adjusted her BP medications because her BP had been a little low. She has not had any more dizzy spells since then. Her GERD is controlled. Her COPD is controlled.    Review of Systems  Constitutional: Negative.   HENT: Negative.   Eyes: Negative.   Respiratory: Negative.   Cardiovascular: Negative.   Gastrointestinal: Negative.   Genitourinary: Negative for decreased urine volume, difficulty urinating, dyspareunia, dysuria, enuresis, flank pain, frequency, hematuria, pelvic pain and urgency.  Musculoskeletal: Negative.   Skin: Negative.   Neurological: Negative.   Psychiatric/Behavioral: Negative.        Objective:   Physical Exam  Constitutional: She is oriented to person, place, and time. She appears well-developed and well-nourished. No distress.  HENT:  Head: Normocephalic and atraumatic.  Right Ear: External ear normal.  Left Ear: External ear normal.  Nose: Nose normal.  Mouth/Throat: Oropharynx is clear and moist. No oropharyngeal exudate.  Eyes: Pupils are equal, round, and reactive to light. Conjunctivae and EOM are normal. No scleral icterus.  Neck: Normal range of motion. Neck supple. No JVD present. No thyromegaly present.  Cardiovascular: Normal rate, regular rhythm, normal heart sounds and intact distal pulses. Exam reveals no gallop and no friction rub.  No murmur heard. Pulmonary/Chest: Effort normal and breath sounds normal. No respiratory distress. She has no wheezes. She has no rales. She exhibits no tenderness.  Abdominal: Soft. Bowel sounds are normal. She exhibits no distension and no mass. There is no  tenderness. There is no rebound and no guarding.  Musculoskeletal: Normal range of motion. She exhibits no edema or tenderness.  Lymphadenopathy:    She has no cervical adenopathy.  Neurological: She is alert and oriented to person, place, and time. She has normal reflexes. She displays normal reflexes. No cranial nerve deficit. She exhibits normal muscle tone. Coordination normal.  Skin: Skin is warm and dry. No rash noted. No erythema.  Psychiatric: She has a normal mood and affect. Her behavior is normal. Judgment and thought content normal.          Assessment & Plan:  She has recovered well from a subdural hematoma. Her BP is now stable. Get fasting labs today tpo check lipids and a potassium level, etc. Her GERD and COPD are well controlled.  [name], MD

## 2018-06-21 ENCOUNTER — Telehealth: Payer: Self-pay | Admitting: Family Medicine

## 2018-06-21 NOTE — Telephone Encounter (Signed)
Copied from [place] [id number]. Topic: Quick Communication - See Telephone Encounter >> Jun 21, 2018  3:53 PM Bea Graff, NT wrote: CRM for notification. See Telephone encounter for: 06/21/18. Pt would like a call with her lab results from last week.

## 2018-06-22 NOTE — Telephone Encounter (Signed)
Normal lab results per Dr. Sarajane Jews.  I have called and lmomtcb x 1 for the pt.

## 2018-06-28 NOTE — Telephone Encounter (Signed)
Called and lmom to make the pt aware of results.

## 2018-07-16 DIAGNOSIS — R69 Illness, unspecified: Secondary | ICD-10-CM | POA: Diagnosis not present

## 2018-07-29 ENCOUNTER — Other Ambulatory Visit: Payer: Self-pay | Admitting: Family Medicine

## 2018-08-04 NOTE — Telephone Encounter (Signed)
Robin from [facility] calling to check on this.  States they have had no response and it has been over 72 business hours. Shirlean Mylar can be reached at [phone]

## 2018-10-12 DIAGNOSIS — H401112 Primary open-angle glaucoma, right eye, moderate stage: Secondary | ICD-10-CM | POA: Diagnosis not present

## 2018-10-13 DIAGNOSIS — B351 Tinea unguium: Secondary | ICD-10-CM | POA: Diagnosis not present

## 2018-10-13 DIAGNOSIS — M79675 Pain in left toe(s): Secondary | ICD-10-CM | POA: Diagnosis not present

## 2018-10-13 DIAGNOSIS — M79674 Pain in right toe(s): Secondary | ICD-10-CM | POA: Diagnosis not present

## 2018-10-27 ENCOUNTER — Other Ambulatory Visit: Payer: Self-pay | Admitting: Family Medicine

## 2018-11-11 DIAGNOSIS — H401432 Capsular glaucoma with pseudoexfoliation of lens, bilateral, moderate stage: Secondary | ICD-10-CM | POA: Diagnosis not present

## 2018-11-11 DIAGNOSIS — H35373 Puckering of macula, bilateral: Secondary | ICD-10-CM | POA: Diagnosis not present

## 2018-11-11 DIAGNOSIS — H25812 Combined forms of age-related cataract, left eye: Secondary | ICD-10-CM | POA: Diagnosis not present

## 2018-11-11 DIAGNOSIS — H25811 Combined forms of age-related cataract, right eye: Secondary | ICD-10-CM | POA: Diagnosis not present

## 2019-01-12 IMAGING — CT CT CERVICAL SPINE W/O CM
4 of 7 series · 15 of 33 positions shown, 16 images · non-contrast
Comparison: Neck CT 02/05/2014. Cervical spine MRI 01/24/2011.

ADDENDUM:
Study discussed by telephone with Dr. PAULUS N CEEJAY on 05/19/2018
at noon.
CLINICAL DATA: 77-year-old female status post fall yesterday.
Subsequent headache, dizziness, nausea vomiting.

EXAM:
CT HEAD WITHOUT CONTRAST
CT CERVICAL SPINE WITHOUT CONTRAST
TECHNIQUE: Multidetector CT imaging of the head and cervical spine was
performed following the standard protocol without intravenous
contrast. Multiplanar CT image reconstructions of the cervical spine
were also generated.

[Series 8: c spine soft · axial · 0.28mm/px · z∈[+1134,+1224]mm · 4 of 77 slices shown]
[im 16/77  soft-tissue]
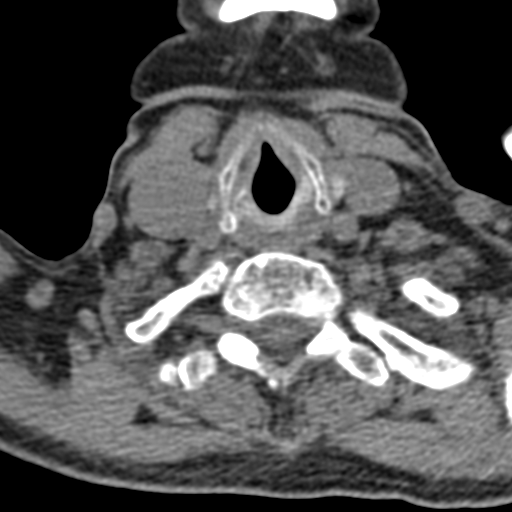
[im 31/77  soft-tissue]
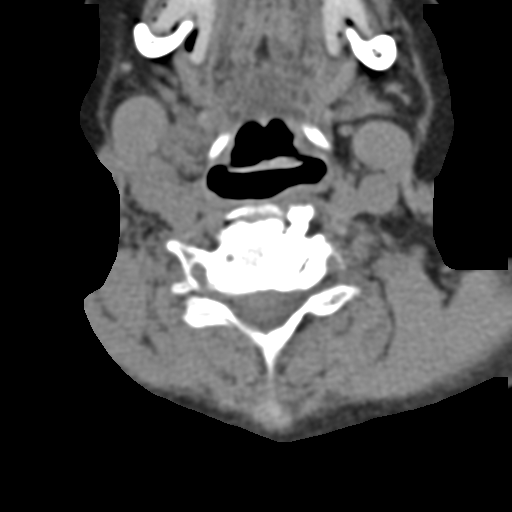
[im 46/77  soft-tissue]
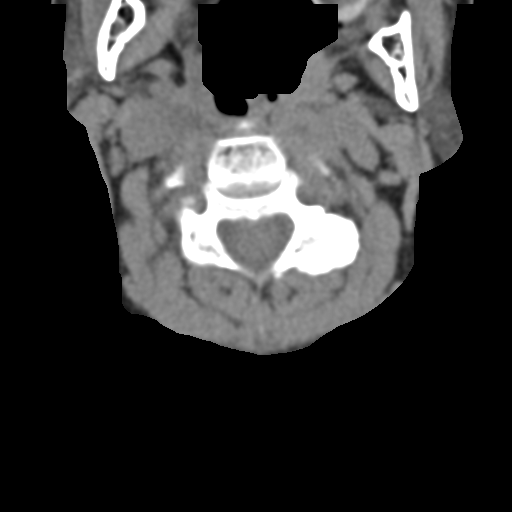
[im 61/77  soft-tissue]
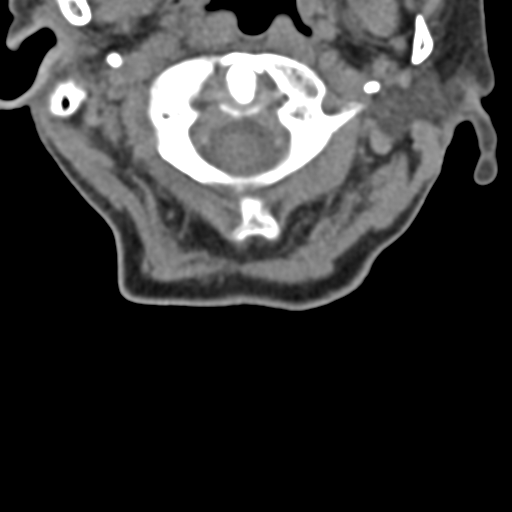

[Series 11: orthogonal bone · axial · 0.23mm/px · z∈[+1104,+1194]mm · 4 of 89 slices shown, 5 images]
[im 18/89  soft-tissue]
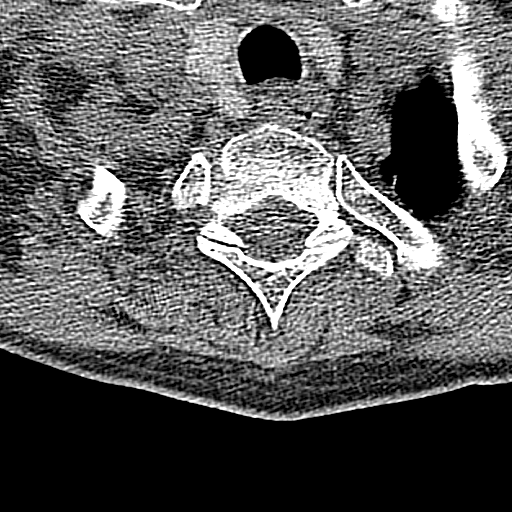
[im 18/89  bone]
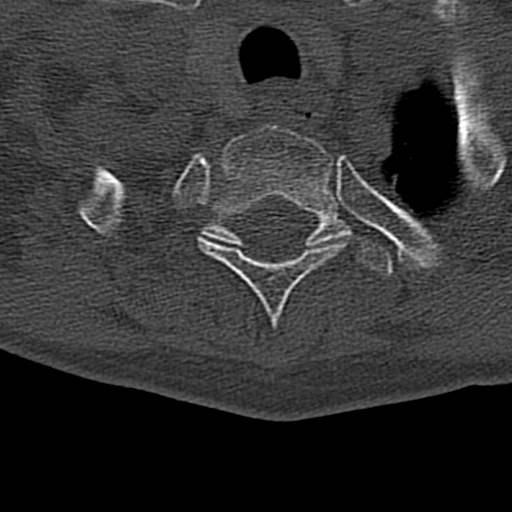
[im 36/89  bone]
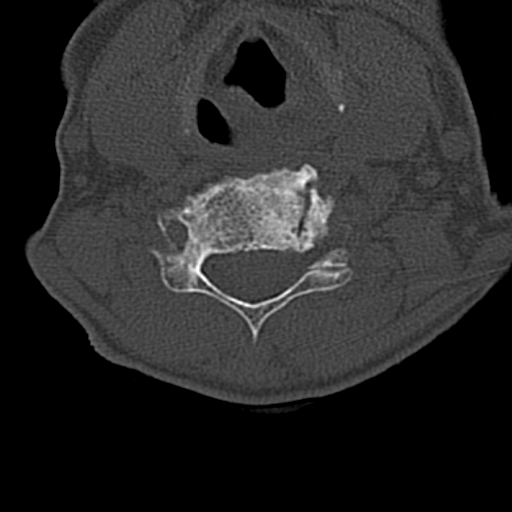
[im 53/89  bone]
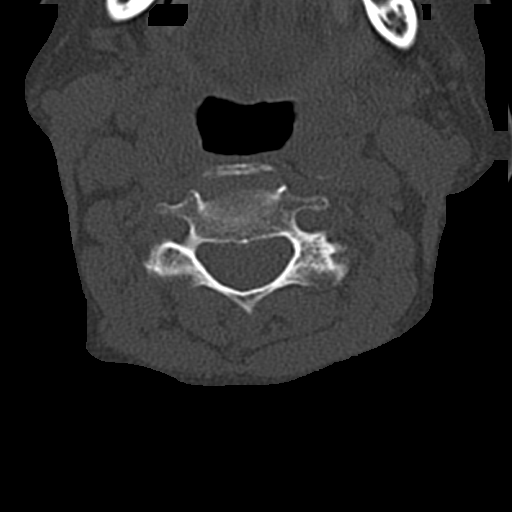
[im 71/89  bone]
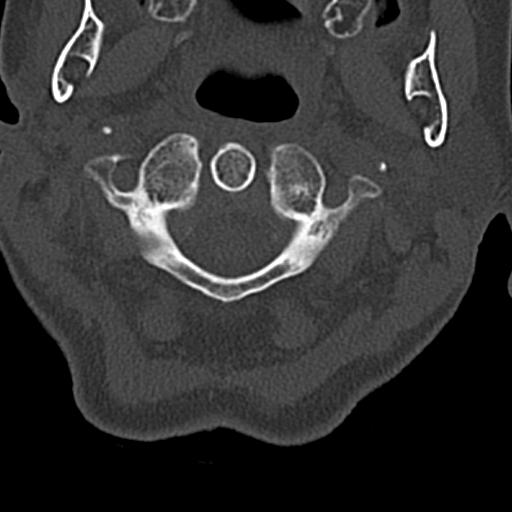

[Series 12: coronal bone · coronal · 0.34mm/px · 3 of 42 slices shown]
[im 11/42  bone]
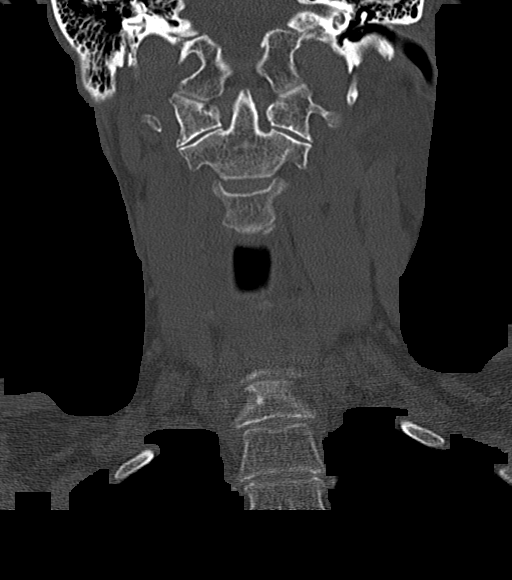
[im 21/42  bone]
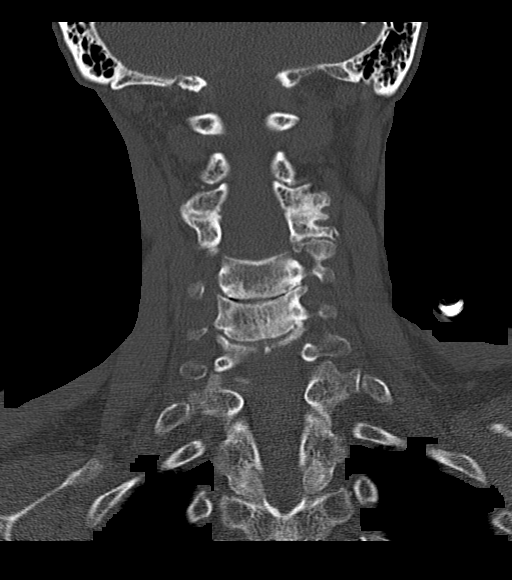
[im 31/42  bone]
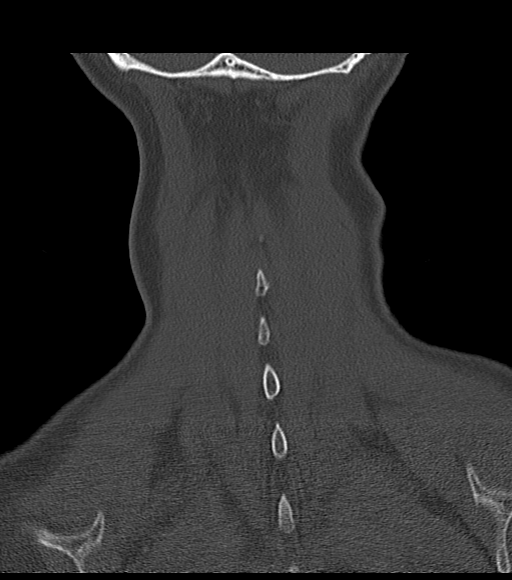

[Series 13: sagittal bone · sagittal · 0.36mm/px · 4 of 44 slices shown]
[im 9/44  bone]
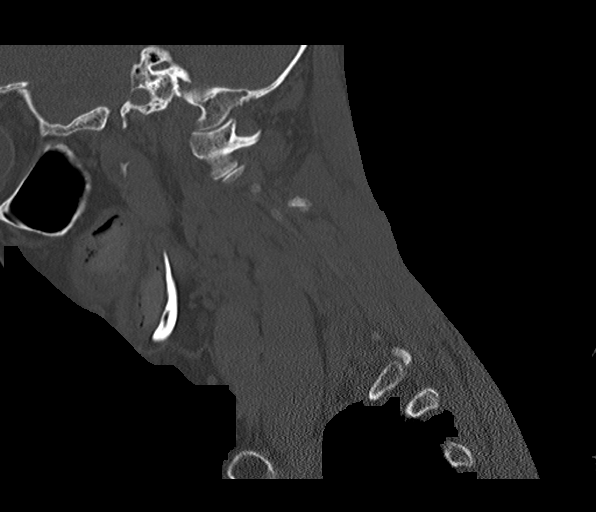
[im 18/44  bone]
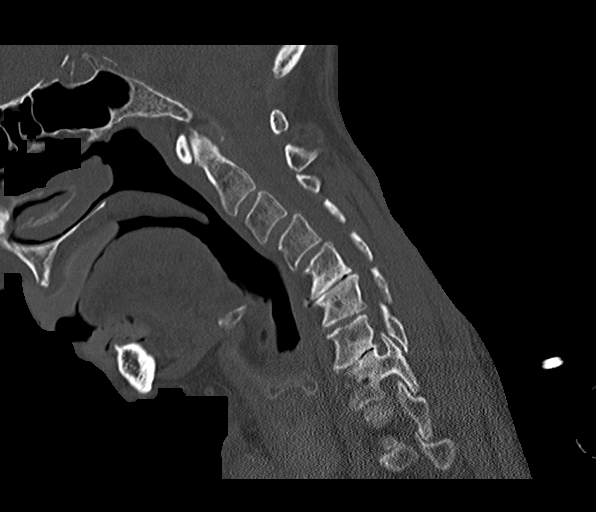
[im 26/44  bone]
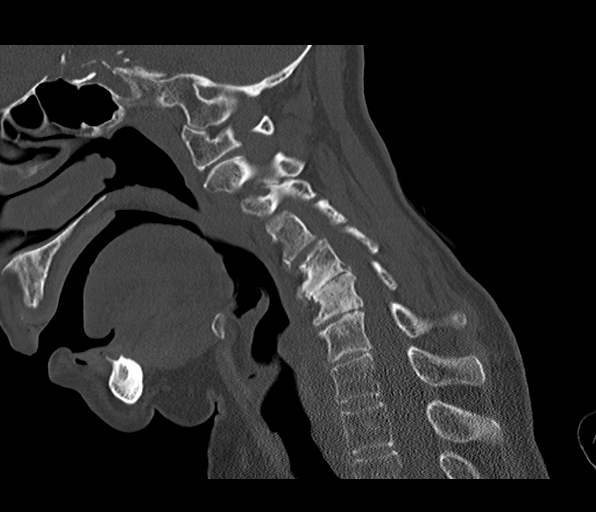
[im 35/44  bone]
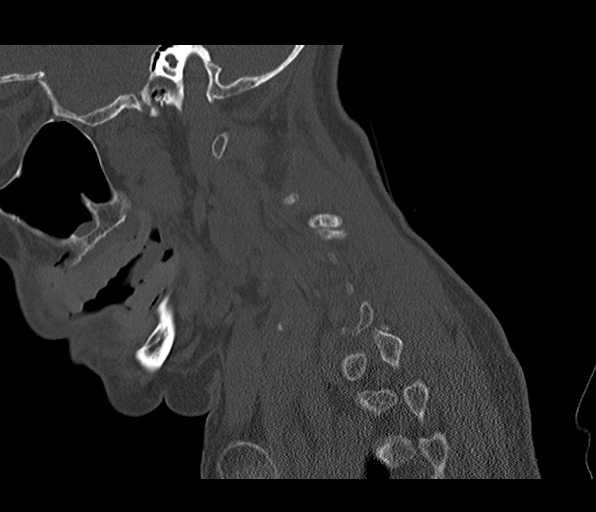

[15 of 33 positions shown; findings below may reference images not displayed]

FINDINGS: CT HEAD FINDINGS

Brain: Broad-based mixed density but mostly hyperdense right side
subdural hematoma (coronal image 39) measuring up to 7 millimeters
in thickness. Trace leftward midline shift. No other significant
intracranial mass effect. Trace subdural layering on the right
tentorium.

No other intracranial hemorrhage identified. No ventriculomegaly. No
cortically based acute infarct identified. Patchy mild for age
nonspecific cerebral white matter hypodensity. Left basal ganglia
dilated perivascular space (normal variant).

Vascular: Calcified atherosclerosis at the skull base.

Skull: No fracture identified.

Sinuses/Orbits: Visualized paranasal sinuses and mastoids are stable
and well pneumatized.

Other: Broad-based right posterior scalp hematoma up to 9
millimeters in thickness. Underlying calvarium intact. No scalp soft
tissue gas.

Visualized orbit soft tissues are within normal limits.

CT CERVICAL SPINE FINDINGS

Alignment: Reversal of cervical lordosis, increased from prior
studies. Degenerative appearing chronic anterolisthesis of C4 on C5
has also mildly progressed. New degenerative appearing mild
anterolisthesis of C3 on C4, also with associated chronic facet
degeneration. Bilateral posterior element alignment is within normal
limits. Cervicothoracic junction alignment is within normal limits.

Skull base and vertebrae: Visualized skull base is intact. No
atlanto-occipital dissociation. No cervical spine fracture
identified.

Soft tissues and spinal canal: No prevertebral fluid or swelling. No
visible canal hematoma. Negative noncontrast neck soft tissues.

Disc levels: Degenerative appearing anterolisthesis of C3 on C4 and
C4 on C5 with multilevel left side upper cervical facet arthropathy.
Chronic but progressed C5-C6 and C6-C7 disc degeneration and
degenerative vertebral body sclerosis since 1833.

Up to mild degenerative spinal stenosis, primarily at C5-C6.

Upper chest: Visible upper thoracic levels appear stable and intact.
Negative lung apices. Negative noncontrast thoracic inlet.

Other: Absent dentition .
IMPRESSION: 1. Right scalp hematoma associated with broad based right side
subdural hematoma measuring up to 7 mm in thickness. No associated
skull fracture identified.
2. Minimal intracranial mass effect with trace leftward midline
shift.
3. No other acute traumatic injury identified in the head or
cervical spine.
4. Progressed since 1833 degenerative appearing cervical spine
spondylolisthesis at C3-C4 and C4-C5. Similarly progressed disc and
vertebral body degeneration at C5-C6 and C6-C7.

## 2019-01-13 IMAGING — CT CT HEAD W/O CM
4 series · 15 of 47 positions shown, 17 images · non-contrast
Comparison: May 19, 2018

CLINICAL DATA: Recent subdural hematoma

EXAM:
CT HEAD WITHOUT CONTRAST
TECHNIQUE: Contiguous axial images were obtained from the base of the skull
through the vertex without intravenous contrast.

[Series 3: head wo · axial · 0.39mm/px · z∈[-174,-64]mm · 7 of 30 slices shown, 9 images]
[im 4/30  brain]
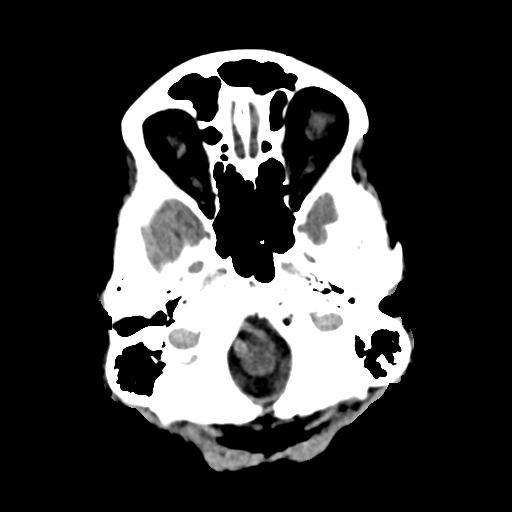
[im 4/30  bone]
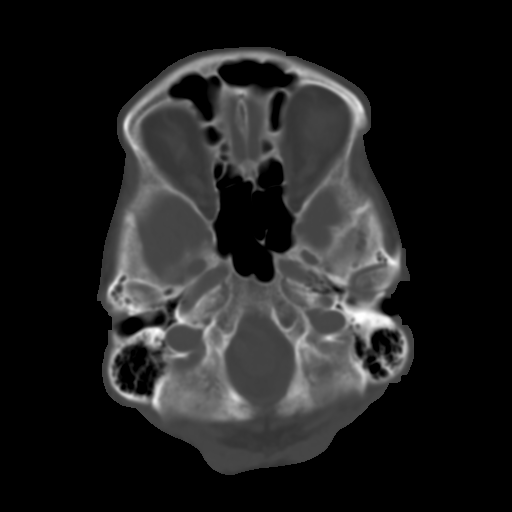
[im 8/30  brain]
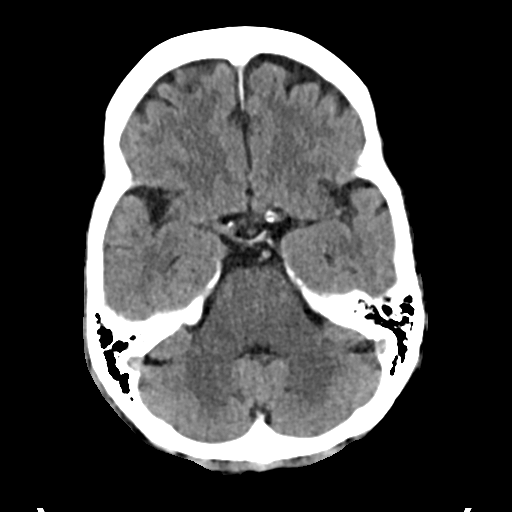
[im 11/30  brain]
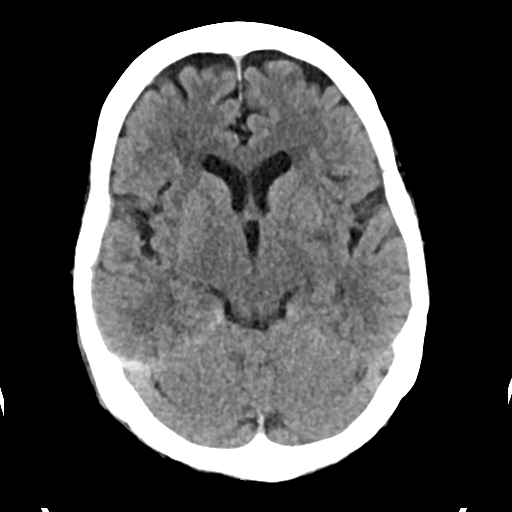
[im 15/30  brain]
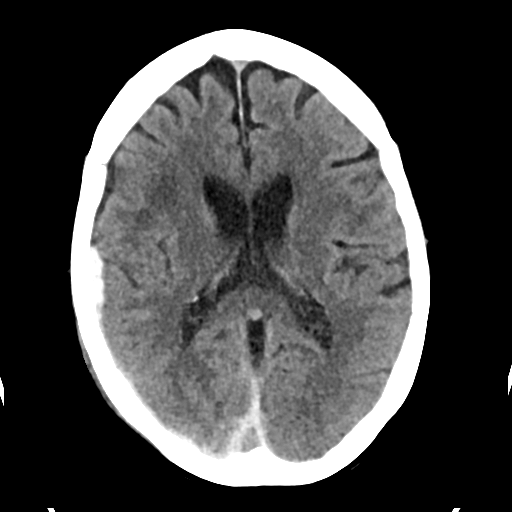
[im 19/30  brain]
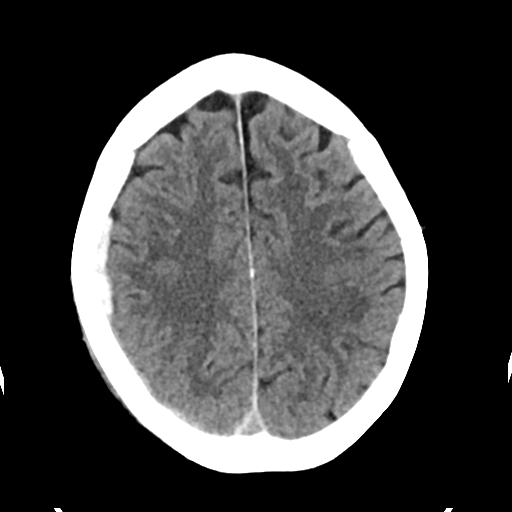
[im 19/30  bone]
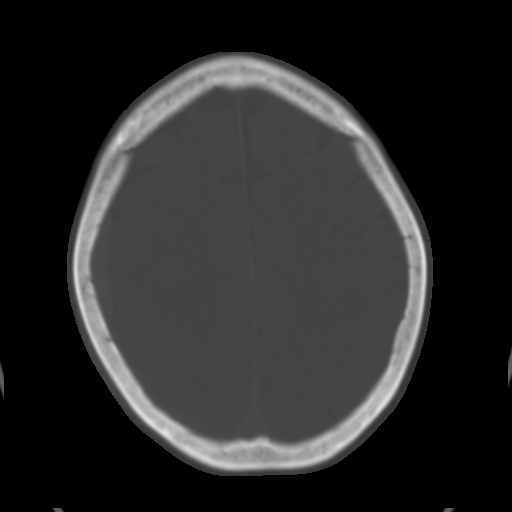
[im 22/30  brain]
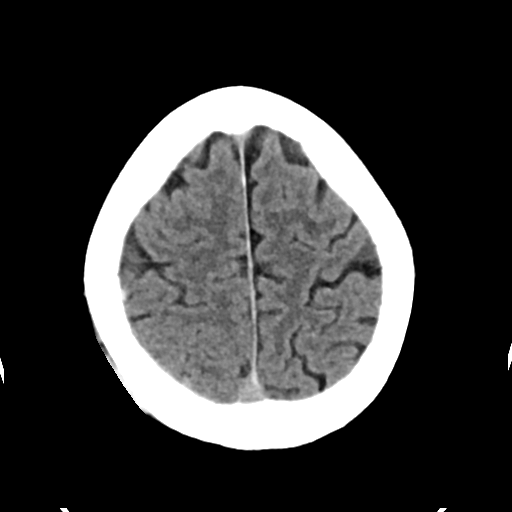
[im 26/30  brain]
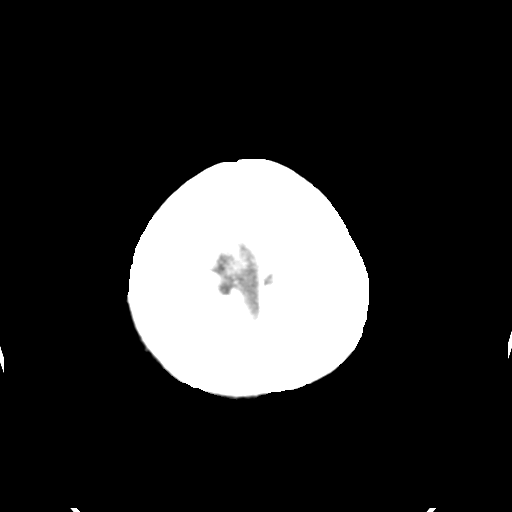

[Series 4: head bone · axial · 0.39mm/px · z∈[-176,-162]mm · 2 of 75 slices shown]
[im 8/75  bone]
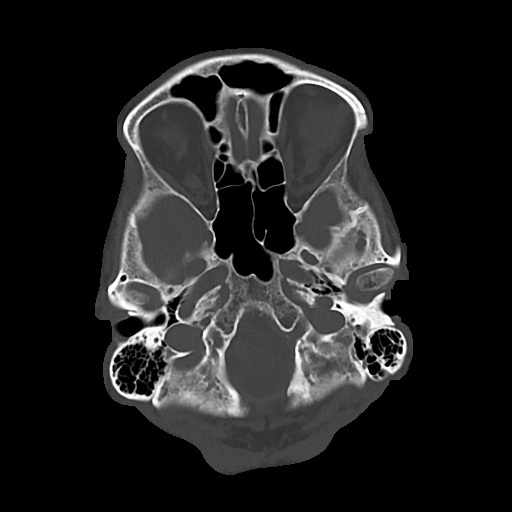
[im 15/75  bone]
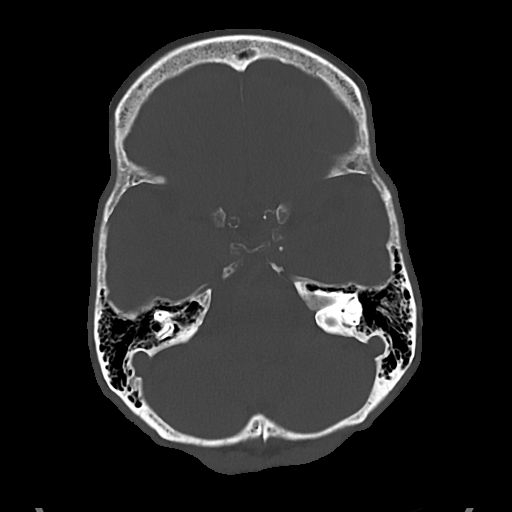

[Series 5: cor soft · coronal · 0.29mm/px · 3 of 67 slices shown]
[im 23/67  brain]
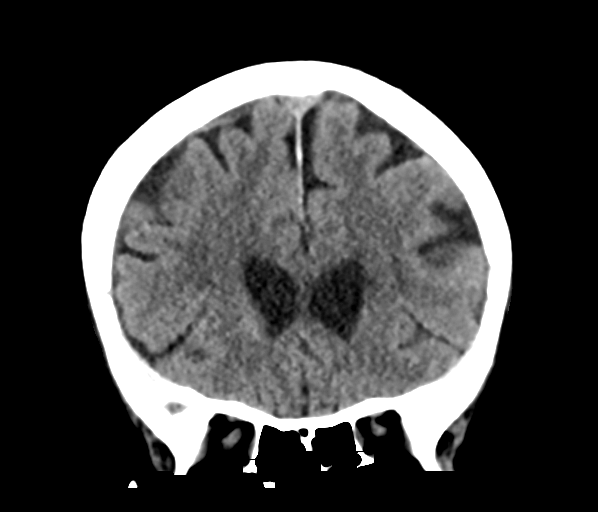
[im 30/67  brain]
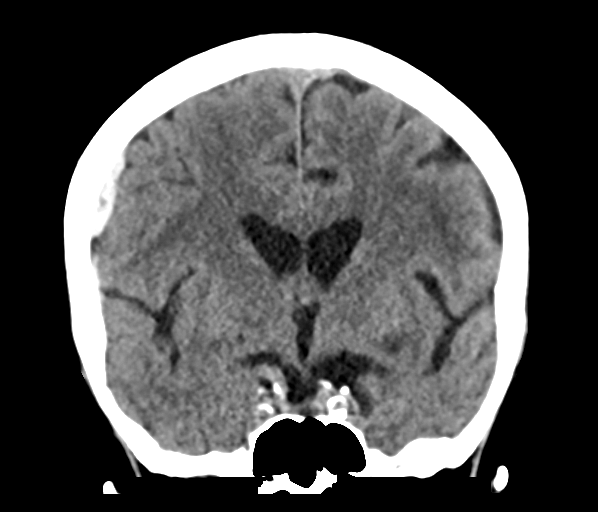
[im 37/67  brain]
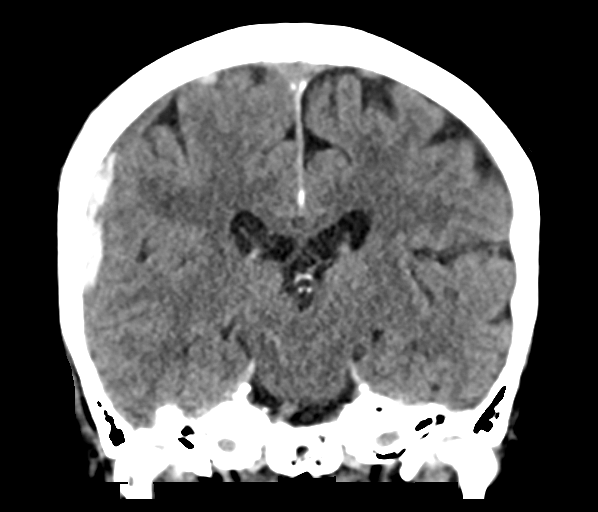

[Series 6: sag soft · sagittal · 0.31mm/px · 3 of 67 slices shown]
[im 23/67  brain]
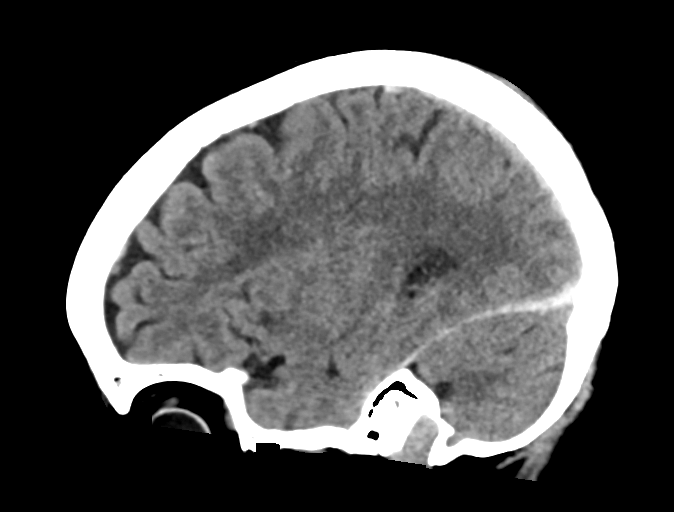
[im 34/67  brain]
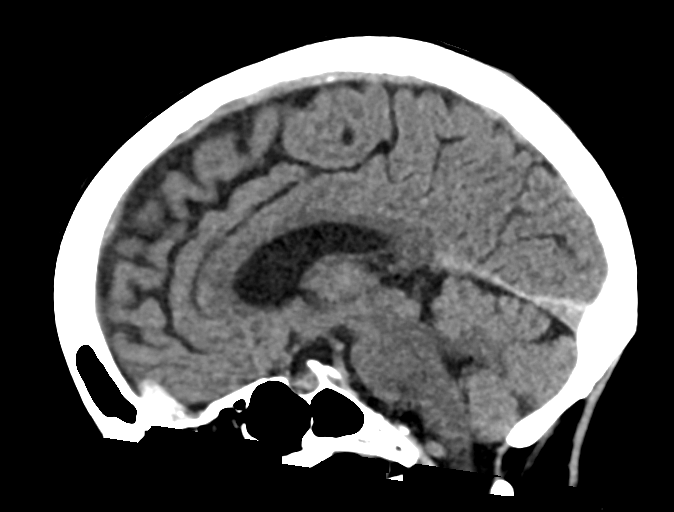
[im 45/67  brain]
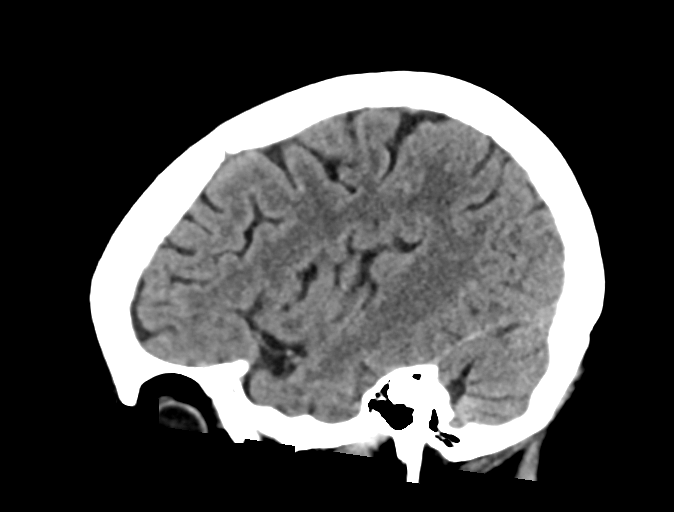

[15 of 47 positions shown; findings below may reference images not displayed]

FINDINGS: Brain: The previously noted right subdural hematoma at the right
frontal-parietal level is again noted with a maximum thickness of 7
mm, stable. There is slight subdural hemorrhage in the right
tentorium without mass effect, stable. There is no new extra-axial
fluid. Minimal midline shift toward the left is stable. There is no
intra-axial mass or hemorrhage. No intra-axial edema. There is age
related volume loss. There is mild periventricular small vessel
disease in the centra semiovale bilaterally. Elsewhere gray-white
compartments appear normal. No acute infarct evident.

Vascular: No hyperdense vessel. There is calcification in each
carotid siphon region.

Skull: Bony calvarium appears intact.

Sinuses/Orbits: There is mucosal thickening in several ethmoid air
cells. Visualized paranasal sinuses elsewhere clear. Visualized
orbits appear symmetric bilaterally..

Other: Mastoid air cells are clear.
IMPRESSION: Stable right frontoparietal subdural hematoma with maximum thickness
of 7 mm. Degree of mass effect in this area focally is stable.
Minimal midline shift to the left is stable. Mild hemorrhage in the
right tentorium is stable without mass effect. No new extra-axial
fluid. No progression of midline shift or progression of mass
effect. No intra-axial hemorrhage is evident.

There is stable age related volume loss with mild periventricular
small vessel disease. No acute infarct.

There are foci of arterial vascular calcification. There is mucosal
thickening in several ethmoid air cells.

## 2019-02-03 DIAGNOSIS — H25811 Combined forms of age-related cataract, right eye: Secondary | ICD-10-CM | POA: Diagnosis not present

## 2019-02-03 DIAGNOSIS — H35373 Puckering of macula, bilateral: Secondary | ICD-10-CM | POA: Diagnosis not present

## 2019-02-03 DIAGNOSIS — H25812 Combined forms of age-related cataract, left eye: Secondary | ICD-10-CM | POA: Diagnosis not present

## 2019-02-03 DIAGNOSIS — H2513 Age-related nuclear cataract, bilateral: Secondary | ICD-10-CM | POA: Diagnosis not present

## 2019-02-03 DIAGNOSIS — H401432 Capsular glaucoma with pseudoexfoliation of lens, bilateral, moderate stage: Secondary | ICD-10-CM | POA: Diagnosis not present

## 2019-02-24 DIAGNOSIS — Z1159 Encounter for screening for other viral diseases: Secondary | ICD-10-CM | POA: Diagnosis not present

## 2019-02-24 DIAGNOSIS — H269 Unspecified cataract: Secondary | ICD-10-CM | POA: Diagnosis not present

## 2019-02-24 DIAGNOSIS — Z01812 Encounter for preprocedural laboratory examination: Secondary | ICD-10-CM | POA: Diagnosis not present

## 2019-02-27 DIAGNOSIS — H35373 Puckering of macula, bilateral: Secondary | ICD-10-CM | POA: Diagnosis not present

## 2019-02-27 DIAGNOSIS — H25811 Combined forms of age-related cataract, right eye: Secondary | ICD-10-CM | POA: Diagnosis not present

## 2019-02-27 DIAGNOSIS — Z87891 Personal history of nicotine dependence: Secondary | ICD-10-CM | POA: Diagnosis not present

## 2019-02-27 DIAGNOSIS — Z880 Allergy status to penicillin: Secondary | ICD-10-CM | POA: Diagnosis not present

## 2019-02-27 DIAGNOSIS — H25813 Combined forms of age-related cataract, bilateral: Secondary | ICD-10-CM | POA: Diagnosis not present

## 2019-02-27 DIAGNOSIS — H401412 Capsular glaucoma with pseudoexfoliation of lens, right eye, moderate stage: Secondary | ICD-10-CM | POA: Diagnosis not present

## 2019-02-27 DIAGNOSIS — H401432 Capsular glaucoma with pseudoexfoliation of lens, bilateral, moderate stage: Secondary | ICD-10-CM | POA: Diagnosis not present

## 2019-02-27 DIAGNOSIS — H2511 Age-related nuclear cataract, right eye: Secondary | ICD-10-CM | POA: Diagnosis not present

## 2019-04-07 ENCOUNTER — Other Ambulatory Visit: Payer: Self-pay | Admitting: Family Medicine

## 2019-04-09 ENCOUNTER — Other Ambulatory Visit: Payer: Self-pay | Admitting: Family Medicine

## 2019-04-14 DIAGNOSIS — H401432 Capsular glaucoma with pseudoexfoliation of lens, bilateral, moderate stage: Secondary | ICD-10-CM | POA: Diagnosis not present

## 2019-05-26 DIAGNOSIS — Z01818 Encounter for other preprocedural examination: Secondary | ICD-10-CM | POA: Diagnosis not present

## 2019-05-29 DIAGNOSIS — H401432 Capsular glaucoma with pseudoexfoliation of lens, bilateral, moderate stage: Secondary | ICD-10-CM | POA: Diagnosis not present

## 2019-05-29 DIAGNOSIS — Z880 Allergy status to penicillin: Secondary | ICD-10-CM | POA: Diagnosis not present

## 2019-05-29 DIAGNOSIS — Z79899 Other long term (current) drug therapy: Secondary | ICD-10-CM | POA: Diagnosis not present

## 2019-05-29 DIAGNOSIS — H25812 Combined forms of age-related cataract, left eye: Secondary | ICD-10-CM | POA: Diagnosis not present

## 2019-05-29 DIAGNOSIS — R69 Illness, unspecified: Secondary | ICD-10-CM | POA: Diagnosis not present

## 2019-05-29 DIAGNOSIS — H401422 Capsular glaucoma with pseudoexfoliation of lens, left eye, moderate stage: Secondary | ICD-10-CM | POA: Diagnosis not present

## 2019-05-29 DIAGNOSIS — E785 Hyperlipidemia, unspecified: Secondary | ICD-10-CM | POA: Diagnosis not present

## 2019-05-29 DIAGNOSIS — H2512 Age-related nuclear cataract, left eye: Secondary | ICD-10-CM | POA: Diagnosis not present

## 2019-06-08 DIAGNOSIS — R69 Illness, unspecified: Secondary | ICD-10-CM | POA: Diagnosis not present

## 2019-07-04 DIAGNOSIS — E559 Vitamin D deficiency, unspecified: Secondary | ICD-10-CM | POA: Diagnosis not present

## 2019-07-04 DIAGNOSIS — E782 Mixed hyperlipidemia: Secondary | ICD-10-CM | POA: Diagnosis not present

## 2019-07-04 DIAGNOSIS — I10 Essential (primary) hypertension: Secondary | ICD-10-CM | POA: Diagnosis not present

## 2019-07-04 DIAGNOSIS — Z780 Asymptomatic menopausal state: Secondary | ICD-10-CM | POA: Diagnosis not present

## 2019-07-04 DIAGNOSIS — R7309 Other abnormal glucose: Secondary | ICD-10-CM | POA: Diagnosis not present

## 2019-07-11 DIAGNOSIS — Z780 Asymptomatic menopausal state: Secondary | ICD-10-CM | POA: Diagnosis not present

## 2019-07-11 DIAGNOSIS — M810 Age-related osteoporosis without current pathological fracture: Secondary | ICD-10-CM | POA: Diagnosis not present

## 2019-08-08 DIAGNOSIS — H35373 Puckering of macula, bilateral: Secondary | ICD-10-CM | POA: Diagnosis not present

## 2019-08-18 DIAGNOSIS — Z0100 Encounter for examination of eyes and vision without abnormal findings: Secondary | ICD-10-CM | POA: Diagnosis not present

## 2019-09-28 ENCOUNTER — Ambulatory Visit: Payer: Medicare HMO

## 2019-10-05 ENCOUNTER — Ambulatory Visit: Payer: Medicare HMO | Attending: Internal Medicine

## 2019-10-05 DIAGNOSIS — Z23 Encounter for immunization: Secondary | ICD-10-CM | POA: Insufficient documentation

## 2019-10-05 NOTE — Progress Notes (Signed)
   Covid-19 Vaccination Clinic  Name:  Alicia Dickson    MRN: [id number] DOB: 1941/04/15  10/05/2019  Alicia Dickson was observed post Covid-19 immunization for 15 minutes without incidence. She was provided with Vaccine Information Sheet and instruction to access the V-Safe system.   Alicia Dickson was instructed to call 911 with any severe reactions post vaccine: Marland Kitchen Difficulty breathing  . Swelling of your face and throat  . A fast heartbeat  . A bad rash all over your body  . Dizziness and weakness    Immunizations Administered    Name Date Dose VIS Date Route   Pfizer COVID-19 Vaccine 10/05/2019  9:29 AM 0.3 mL 08/11/2019 Intramuscular   Manufacturer: [place]   Lot: [id number]   [place]: [id number]

## 2019-10-26 ENCOUNTER — Ambulatory Visit: Payer: Medicare HMO

## 2019-10-27 ENCOUNTER — Ambulatory Visit: Payer: Medicare HMO

## 2019-10-30 ENCOUNTER — Ambulatory Visit: Payer: Medicare HMO | Attending: Internal Medicine

## 2019-10-30 DIAGNOSIS — Z23 Encounter for immunization: Secondary | ICD-10-CM | POA: Insufficient documentation

## 2019-10-30 NOTE — Progress Notes (Signed)
   Covid-19 Vaccination Clinic  Name:  Alicia Dickson    MRN: [id number] DOB: 01-Jun-1941  10/30/2019  [name] was observed post Covid-19 immunization for 15 minutes without incidence. She was provided with Vaccine Information Sheet and instruction to access the V-Safe system.   [name] was instructed to call 911 with any severe reactions post vaccine: Marland Kitchen Difficulty breathing  . Swelling of your face and throat  . A fast heartbeat  . A bad rash all over your body  . Dizziness and weakness    Immunizations Administered    Name Date Dose VIS Date Route   Pfizer COVID-19 Vaccine 10/30/2019  2:08 PM 0.3 mL 08/11/2019 Intramuscular   Manufacturer: [place]   Lot: [id number]   [place]: [id number]

## 2019-11-24 DIAGNOSIS — H401422 Capsular glaucoma with pseudoexfoliation of lens, left eye, moderate stage: Secondary | ICD-10-CM | POA: Diagnosis not present

## 2019-11-24 DIAGNOSIS — H401412 Capsular glaucoma with pseudoexfoliation of lens, right eye, moderate stage: Secondary | ICD-10-CM | POA: Diagnosis not present

## 2019-11-24 DIAGNOSIS — H35351 Cystoid macular degeneration, right eye: Secondary | ICD-10-CM | POA: Diagnosis not present

## 2019-11-24 DIAGNOSIS — H35373 Puckering of macula, bilateral: Secondary | ICD-10-CM | POA: Diagnosis not present

## 2019-12-08 DIAGNOSIS — H35033 Hypertensive retinopathy, bilateral: Secondary | ICD-10-CM | POA: Diagnosis not present

## 2019-12-08 DIAGNOSIS — H43823 Vitreomacular adhesion, bilateral: Secondary | ICD-10-CM | POA: Diagnosis not present

## 2019-12-08 DIAGNOSIS — H35371 Puckering of macula, right eye: Secondary | ICD-10-CM | POA: Diagnosis not present

## 2019-12-08 DIAGNOSIS — H59033 Cystoid macular edema following cataract surgery, bilateral: Secondary | ICD-10-CM | POA: Diagnosis not present

## 2020-01-02 DIAGNOSIS — R7309 Other abnormal glucose: Secondary | ICD-10-CM | POA: Diagnosis not present

## 2020-01-02 DIAGNOSIS — I10 Essential (primary) hypertension: Secondary | ICD-10-CM | POA: Diagnosis not present

## 2020-01-02 DIAGNOSIS — M810 Age-related osteoporosis without current pathological fracture: Secondary | ICD-10-CM | POA: Diagnosis not present

## 2020-01-02 DIAGNOSIS — E559 Vitamin D deficiency, unspecified: Secondary | ICD-10-CM | POA: Diagnosis not present

## 2020-01-02 DIAGNOSIS — E782 Mixed hyperlipidemia: Secondary | ICD-10-CM | POA: Diagnosis not present

## 2020-01-05 DIAGNOSIS — H35033 Hypertensive retinopathy, bilateral: Secondary | ICD-10-CM | POA: Diagnosis not present

## 2020-01-05 DIAGNOSIS — H35371 Puckering of macula, right eye: Secondary | ICD-10-CM | POA: Diagnosis not present

## 2020-01-05 DIAGNOSIS — H43823 Vitreomacular adhesion, bilateral: Secondary | ICD-10-CM | POA: Diagnosis not present

## 2020-01-05 DIAGNOSIS — H59033 Cystoid macular edema following cataract surgery, bilateral: Secondary | ICD-10-CM | POA: Diagnosis not present

## 2020-01-26 ENCOUNTER — Other Ambulatory Visit: Payer: Self-pay | Admitting: Family Medicine

## 2020-02-23 DIAGNOSIS — H9202 Otalgia, left ear: Secondary | ICD-10-CM | POA: Diagnosis not present

## 2020-02-27 DIAGNOSIS — H59033 Cystoid macular edema following cataract surgery, bilateral: Secondary | ICD-10-CM | POA: Diagnosis not present

## 2020-02-27 DIAGNOSIS — H35033 Hypertensive retinopathy, bilateral: Secondary | ICD-10-CM | POA: Diagnosis not present

## 2020-02-27 DIAGNOSIS — H35371 Puckering of macula, right eye: Secondary | ICD-10-CM | POA: Diagnosis not present

## 2020-02-27 DIAGNOSIS — H43823 Vitreomacular adhesion, bilateral: Secondary | ICD-10-CM | POA: Diagnosis not present

## 2020-03-29 DIAGNOSIS — H401412 Capsular glaucoma with pseudoexfoliation of lens, right eye, moderate stage: Secondary | ICD-10-CM | POA: Diagnosis not present

## 2020-03-29 DIAGNOSIS — H401422 Capsular glaucoma with pseudoexfoliation of lens, left eye, moderate stage: Secondary | ICD-10-CM | POA: Diagnosis not present

## 2020-03-29 DIAGNOSIS — H35373 Puckering of macula, bilateral: Secondary | ICD-10-CM | POA: Diagnosis not present

## 2020-03-29 DIAGNOSIS — H35351 Cystoid macular degeneration, right eye: Secondary | ICD-10-CM | POA: Diagnosis not present

## 2020-06-14 DIAGNOSIS — H35371 Puckering of macula, right eye: Secondary | ICD-10-CM | POA: Diagnosis not present

## 2020-06-14 DIAGNOSIS — H59033 Cystoid macular edema following cataract surgery, bilateral: Secondary | ICD-10-CM | POA: Diagnosis not present

## 2020-06-14 DIAGNOSIS — H35033 Hypertensive retinopathy, bilateral: Secondary | ICD-10-CM | POA: Diagnosis not present

## 2020-06-14 DIAGNOSIS — H43823 Vitreomacular adhesion, bilateral: Secondary | ICD-10-CM | POA: Diagnosis not present

## 2020-06-25 DIAGNOSIS — R69 Illness, unspecified: Secondary | ICD-10-CM | POA: Diagnosis not present

## 2020-07-09 DIAGNOSIS — E559 Vitamin D deficiency, unspecified: Secondary | ICD-10-CM | POA: Diagnosis not present

## 2020-07-09 DIAGNOSIS — I10 Essential (primary) hypertension: Secondary | ICD-10-CM | POA: Diagnosis not present

## 2020-07-09 DIAGNOSIS — E782 Mixed hyperlipidemia: Secondary | ICD-10-CM | POA: Diagnosis not present

## 2020-07-09 DIAGNOSIS — R7309 Other abnormal glucose: Secondary | ICD-10-CM | POA: Diagnosis not present
